# Patient Record
Sex: Male | Born: 1945 | Race: White | Hispanic: No | Marital: Married | Smoking: Never smoker
Health system: Southern US, Community
[De-identification: ages and names within clinical notes are randomized; demographics above are authoritative.]

## PROBLEM LIST (undated history)

## (undated) DIAGNOSIS — H269 Unspecified cataract: Secondary | ICD-10-CM

## (undated) DIAGNOSIS — I493 Ventricular premature depolarization: Secondary | ICD-10-CM

## (undated) DIAGNOSIS — R7302 Impaired glucose tolerance (oral): Secondary | ICD-10-CM

## (undated) DIAGNOSIS — R002 Palpitations: Secondary | ICD-10-CM

## (undated) DIAGNOSIS — I714 Abdominal aortic aneurysm, without rupture, unspecified: Secondary | ICD-10-CM

## (undated) DIAGNOSIS — I10 Essential (primary) hypertension: Secondary | ICD-10-CM

## (undated) DIAGNOSIS — E739 Lactose intolerance, unspecified: Secondary | ICD-10-CM

## (undated) DIAGNOSIS — N402 Nodular prostate without lower urinary tract symptoms: Secondary | ICD-10-CM

## (undated) DIAGNOSIS — M19071 Primary osteoarthritis, right ankle and foot: Secondary | ICD-10-CM

## (undated) DIAGNOSIS — K210 Gastro-esophageal reflux disease with esophagitis: Secondary | ICD-10-CM

## (undated) DIAGNOSIS — M19072 Primary osteoarthritis, left ankle and foot: Secondary | ICD-10-CM

## (undated) DIAGNOSIS — M25579 Pain in unspecified ankle and joints of unspecified foot: Secondary | ICD-10-CM

## (undated) DIAGNOSIS — G479 Sleep disorder, unspecified: Secondary | ICD-10-CM

## (undated) DIAGNOSIS — K219 Gastro-esophageal reflux disease without esophagitis: Secondary | ICD-10-CM

## (undated) DIAGNOSIS — H3320 Serous retinal detachment, unspecified eye: Secondary | ICD-10-CM

## (undated) DIAGNOSIS — E785 Hyperlipidemia, unspecified: Secondary | ICD-10-CM

## (undated) DIAGNOSIS — Z955 Presence of coronary angioplasty implant and graft: Secondary | ICD-10-CM

## (undated) DIAGNOSIS — IMO0002 Reserved for concepts with insufficient information to code with codable children: Secondary | ICD-10-CM

## (undated) DIAGNOSIS — E7800 Pure hypercholesterolemia, unspecified: Secondary | ICD-10-CM

## (undated) DIAGNOSIS — I8390 Asymptomatic varicose veins of unspecified lower extremity: Secondary | ICD-10-CM

## (undated) DIAGNOSIS — T7840XA Allergy, unspecified, initial encounter: Secondary | ICD-10-CM

## (undated) DIAGNOSIS — I2510 Atherosclerotic heart disease of native coronary artery without angina pectoris: Secondary | ICD-10-CM

## (undated) DIAGNOSIS — N400 Enlarged prostate without lower urinary tract symptoms: Secondary | ICD-10-CM

## (undated) HISTORY — DX: Serous retinal detachment, unspecified eye: H33.20

## (undated) HISTORY — DX: Pain in unspecified ankle and joints of unspecified foot: M25.579

## (undated) HISTORY — DX: Palpitations: R00.2

## (undated) HISTORY — DX: Pure hypercholesterolemia, unspecified: E78.00

## (undated) HISTORY — DX: Benign prostatic hyperplasia without lower urinary tract symptoms: N40.0

## (undated) HISTORY — DX: Allergy, unspecified, initial encounter: T78.40XA

## (undated) HISTORY — DX: Lactose intolerance, unspecified: E73.9

## (undated) HISTORY — DX: Primary osteoarthritis, left ankle and foot: M19.072

## (undated) HISTORY — DX: Unspecified cataract: H26.9

## (undated) HISTORY — DX: Essential (primary) hypertension: I10

## (undated) HISTORY — DX: Asymptomatic varicose veins of unspecified lower extremity: I83.90

## (undated) HISTORY — DX: Reserved for concepts with insufficient information to code with codable children: IMO0002

## (undated) HISTORY — DX: Primary osteoarthritis, right ankle and foot: M19.071

## (undated) HISTORY — DX: Nodular prostate without lower urinary tract symptoms: N40.2

## (undated) HISTORY — DX: Abdominal aortic aneurysm, without rupture, unspecified: I71.40

## (undated) HISTORY — DX: Ventricular premature depolarization: I49.3

## (undated) HISTORY — DX: Sleep disorder, unspecified: G47.9

## (undated) HISTORY — DX: Hyperlipidemia, unspecified: E78.5

## (undated) HISTORY — DX: Impaired glucose tolerance (oral): R73.02

## (undated) HISTORY — DX: Abdominal aortic aneurysm, without rupture: I71.4

## (undated) HISTORY — DX: Gastro-esophageal reflux disease without esophagitis: K21.9

## (undated) HISTORY — DX: Gastro-esophageal reflux disease with esophagitis: K21.0

---
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
## 1993-01-18 DIAGNOSIS — E785 Hyperlipidemia, unspecified: Secondary | ICD-10-CM

## 1993-01-18 HISTORY — DX: Hyperlipidemia, unspecified: E78.5

## 1997-01-18 DIAGNOSIS — N400 Enlarged prostate without lower urinary tract symptoms: Secondary | ICD-10-CM

## 1997-01-18 HISTORY — DX: Benign prostatic hyperplasia without lower urinary tract symptoms: N40.0

## 1999-01-19 HISTORY — PX: CATARACT EXTRACTION W/ INTRAOCULAR LENS IMPLANT: SHX1309

## 2000-01-19 DIAGNOSIS — G479 Sleep disorder, unspecified: Secondary | ICD-10-CM

## 2000-01-19 HISTORY — DX: Sleep disorder, unspecified: G47.9

## 2000-07-31 ENCOUNTER — Observation Stay (HOSPITAL_COMMUNITY): Admission: EM | Admit: 2000-07-31 | Discharge: 2000-07-31 | Payer: Self-pay | Admitting: Ophthalmology

## 2001-01-18 DIAGNOSIS — H3320 Serous retinal detachment, unspecified eye: Secondary | ICD-10-CM

## 2001-01-18 HISTORY — PX: RETINAL DETACHMENT SURGERY: SHX105

## 2001-01-18 HISTORY — DX: Serous retinal detachment, unspecified eye: H33.20

## 2002-01-18 DIAGNOSIS — K2100 Gastro-esophageal reflux disease with esophagitis, without bleeding: Secondary | ICD-10-CM

## 2002-01-18 HISTORY — DX: Gastro-esophageal reflux disease with esophagitis, without bleeding: K21.00

## 2007-01-19 DIAGNOSIS — N402 Nodular prostate without lower urinary tract symptoms: Secondary | ICD-10-CM

## 2007-01-19 HISTORY — DX: Nodular prostate without lower urinary tract symptoms: N40.2

## 2009-01-18 DIAGNOSIS — R7302 Impaired glucose tolerance (oral): Secondary | ICD-10-CM

## 2009-01-18 HISTORY — DX: Impaired glucose tolerance (oral): R73.02

## 2010-05-27 ENCOUNTER — Other Ambulatory Visit: Payer: Self-pay | Admitting: Family Medicine

## 2010-05-27 DIAGNOSIS — I714 Abdominal aortic aneurysm, without rupture: Secondary | ICD-10-CM

## 2010-05-29 ENCOUNTER — Ambulatory Visit
Admission: RE | Admit: 2010-05-29 | Discharge: 2010-05-29 | Disposition: A | Discharge status: Home / Self Care | Payer: Medicare Other | Source: Ambulatory Visit | Attending: Family Medicine | Admitting: Family Medicine

## 2010-05-29 DIAGNOSIS — I714 Abdominal aortic aneurysm, without rupture: Secondary | ICD-10-CM

## 2011-11-19 ENCOUNTER — Other Ambulatory Visit: Payer: Self-pay | Admitting: Orthopedic Surgery

## 2011-11-23 NOTE — Progress Notes (Signed)
Pt instructions given

## 2011-11-24 NOTE — H&P (Signed)
    Recently while working in his yard he impaled his right index MP joint on a rose thorn. He was able to remove the thorn, but was immediately aware of an impaled foreign body deep to the skin.   He cleaned the wound well, but was unable to extrude his thorn foreign body.   He has developed swelling and tenderness.  The date of injury was 10.10.13.   He now presents for evaluation of his index MP joint.    His past history is reviewed in detail. He is 6'3" tall and weighs 185 pounds. He is not taking any prescription pain medicine.  He has not taken any antibiotics to date. He has no drug allergies.  Current medications are simvastatin.  Prior surgery, tonsillectomy 1950, cataract extraction and lens implant in 2000, detached retina in 2003.    Social history reveals that he is married, he is a nonsmoker, he enjoys a rare alcoholic beverage.    Family history is detailed and positive for father having coronary artery disease.  14-point review of systems reveals corrective lenses, history of cataracts.    Physical examination reveals a thin, fit, well appearing 66 year-old gentleman.  He was a Veterinary surgeon at [employer]. His hand examination reveals an obvious foreign body on the radial aspect of his right index finger MP joint. This is mildly tender to touch. There is an encapsulated area of granulation tissue.  He does not show generalized swelling or signs of a fungal/ sporotrichoses type response. He has no sign of lymphangitis or lymphadenopathy.  Plain films of his hand are nondiagnostic.  ASSESSMENT:   Buried thorn foreign body.   PLAN:  We will schedule him for removal under straight local anesthesia.  While the risk is small, rose thorns can lead to a chronic infection with sporotrichosis or other fungal elements.  We will removal his foreign body and cover him with doxycycline and/or Biaxin for several weeks post-op to be absolutely certain this is not a problem. H&P documentation:  11/25/2011  -History and Physical Reviewed  -Patient has been re-examined  -No change in the plan of care  Wyn Forster, MD

## 2011-11-25 ENCOUNTER — Encounter (HOSPITAL_BASED_OUTPATIENT_CLINIC_OR_DEPARTMENT_OTHER)
Admission: RE | Disposition: A | Discharge status: Home / Self Care | Payer: Self-pay | Source: Ambulatory Visit | Attending: Orthopedic Surgery

## 2011-11-25 ENCOUNTER — Ambulatory Visit (HOSPITAL_BASED_OUTPATIENT_CLINIC_OR_DEPARTMENT_OTHER)
Admission: RE | Admit: 2011-11-25 | Discharge: 2011-11-25 | Disposition: A | Discharge status: Home / Self Care | Payer: Medicare Other | Source: Ambulatory Visit | Attending: Orthopedic Surgery | Admitting: Orthopedic Surgery

## 2011-11-25 DIAGNOSIS — Y998 Other external cause status: Secondary | ICD-10-CM | POA: Insufficient documentation

## 2011-11-25 DIAGNOSIS — Z1839 Other retained organic fragments: Secondary | ICD-10-CM | POA: Insufficient documentation

## 2011-11-25 DIAGNOSIS — W268XXA Contact with other sharp object(s), not elsewhere classified, initial encounter: Secondary | ICD-10-CM | POA: Insufficient documentation

## 2011-11-25 DIAGNOSIS — S61409A Unspecified open wound of unspecified hand, initial encounter: Secondary | ICD-10-CM | POA: Insufficient documentation

## 2011-11-25 HISTORY — PX: LESION REMOVAL: SHX5196

## 2011-11-25 SURGERY — MINOR EXCISION OF LESION
Anesthesia: LOCAL | Site: Finger | Laterality: Right | Wound class: Clean

## 2011-11-25 MED ORDER — DOXYCYCLINE HYCLATE 100 MG PO TABS: 100.0000 mg | ORAL_TABLET | Freq: Two times a day (BID) | ORAL | Status: DC

## 2011-11-25 MED ORDER — TRAMADOL HCL 50 MG PO TABS: ORAL_TABLET | ORAL | Status: DC

## 2011-11-25 MED ORDER — LIDOCAINE HCL 2 % IJ SOLN
INTRAMUSCULAR | Status: DC | PRN
  Administered 2011-11-25: 4 mL

## 2011-11-25 SURGICAL SUPPLY — 40 items
BANDAGE ADHESIVE 1X3 (GAUZE/BANDAGES/DRESSINGS) IMPLANT
BLADE SURG 15 STRL LF DISP TIS (BLADE) ×1 IMPLANT
BLADE SURG 15 STRL SS (BLADE) ×2
BNDG CMPR 9X4 STRL LF SNTH (GAUZE/BANDAGES/DRESSINGS)
BNDG CMPR MD 5X2 ELC HKLP STRL (GAUZE/BANDAGES/DRESSINGS) ×1
BNDG COHESIVE 1X5 TAN STRL LF (GAUZE/BANDAGES/DRESSINGS) IMPLANT
BNDG ELASTIC 2 VLCR STRL LF (GAUZE/BANDAGES/DRESSINGS) ×1 IMPLANT
BNDG ESMARK 4X9 LF (GAUZE/BANDAGES/DRESSINGS) IMPLANT
BRUSH SCRUB EZ PLAIN DRY (MISCELLANEOUS) ×2 IMPLANT
CLOTH BEACON ORANGE TIMEOUT ST (SAFETY) ×2 IMPLANT
CORDS BIPOLAR (ELECTRODE) IMPLANT
COVER MAYO STAND STRL (DRAPES) ×2 IMPLANT
CUFF TOURNIQUET SINGLE 18IN (TOURNIQUET CUFF) ×1 IMPLANT
DECANTER SPIKE VIAL GLASS SM (MISCELLANEOUS) IMPLANT
DRAIN PENROSE 1/2X12 LTX STRL (WOUND CARE) IMPLANT
DRAPE SURG 17X23 STRL (DRAPES) ×2 IMPLANT
GAUZE SPONGE 4X4 12PLY STRL LF (GAUZE/BANDAGES/DRESSINGS) ×2 IMPLANT
GAUZE XEROFORM 1X8 LF (GAUZE/BANDAGES/DRESSINGS) ×1 IMPLANT
GLOVE BIO SURGEON STRL SZ 6.5 (GLOVE) ×2 IMPLANT
GLOVE BIOGEL M STRL SZ7.5 (GLOVE) ×2 IMPLANT
GLOVE ORTHO TXT STRL SZ7.5 (GLOVE) ×2 IMPLANT
GOWN BRE IMP PREV XXLGXLNG (GOWN DISPOSABLE) ×2 IMPLANT
GOWN PREVENTION PLUS XLARGE (GOWN DISPOSABLE) ×1 IMPLANT
NDL SAFETY ECLIPSE 18X1.5 (NEEDLE) IMPLANT
NEEDLE 27GAX1X1/2 (NEEDLE) ×1 IMPLANT
NEEDLE HYPO 18GX1.5 SHARP (NEEDLE) ×2
PACK BASIN DAY SURGERY FS (CUSTOM PROCEDURE TRAY) ×2 IMPLANT
PADDING CAST ABS 4INX4YD NS (CAST SUPPLIES)
PADDING CAST ABS COTTON 4X4 ST (CAST SUPPLIES) ×1 IMPLANT
SPONGE GAUZE 4X4 12PLY (GAUZE/BANDAGES/DRESSINGS) ×2 IMPLANT
STOCKINETTE 4X48 STRL (DRAPES) ×2 IMPLANT
SUT CHROMIC 6 0 PS 4 (SUTURE) ×1 IMPLANT
SUT ETHILON 5 0 P 3 18 (SUTURE)
SUT NYLON ETHILON 5-0 P-3 1X18 (SUTURE) ×1 IMPLANT
SYR 3ML 23GX1 SAFETY (SYRINGE) IMPLANT
SYR CONTROL 10ML LL (SYRINGE) ×1 IMPLANT
TOWEL OR 17X24 6PK STRL BLUE (TOWEL DISPOSABLE) ×4 IMPLANT
TRAY DSU PREP LF (CUSTOM PROCEDURE TRAY) ×2 IMPLANT
UNDERPAD 30X30 INCONTINENT (UNDERPADS AND DIAPERS) ×2 IMPLANT
WATER STERILE IRR 1000ML POUR (IV SOLUTION) ×1 IMPLANT

## 2011-11-25 NOTE — Brief Op Note (Signed)
11/25/2011  12:41 PM  PATIENT:  Rod Scheibe  66 y.o. male  PRE-OPERATIVE DIAGNOSIS:  ROSE THORN FOREIGN BODY RIGHT INDEX FINGER  POST-OPERATIVE DIAGNOSIS: encapsulated rose thorn right index MP joint region  PROCEDURE:  Procedure(s) (LRB) with comments: MINOR EXICISION OF LESION (Right) - REMOVE THORN RIGHT INDEX FINGER  SURGEON:  Surgeon(s) and Role:    * Wyn Forster., MD - Primary  PHYSICIAN ASSISTANT:   ASSISTANTS:Brie Eppard Dasnoit,P.A-C   ANESTHESIA:   local  EBL:     BLOOD ADMINISTERED:none  DRAINS: none   LOCAL MEDICATIONS USED:  XYLOCAINE   SPECIMEN:  No Specimen  DISPOSITION OF SPECIMEN:  N/A  COUNTS:  YES  TOURNIQUET:   Total Tourniquet Time Documented: Forearm (Right) - 6 minutes  DICTATION: .Other Dictation: Dictation Number [id number]  PLAN OF CARE: Discharge to home after PACU  PATIENT DISPOSITION:  PACU - hemodynamically stable.

## 2011-11-25 NOTE — Op Note (Signed)
[id number] 

## 2011-11-26 ENCOUNTER — Encounter (HOSPITAL_BASED_OUTPATIENT_CLINIC_OR_DEPARTMENT_OTHER): Payer: Self-pay | Admitting: Orthopedic Surgery

## 2011-11-29 NOTE — Op Note (Signed)
NAMEKADIN, CAISSE NO.:  [id number]  MEDICAL RECORD NO.:  [id number]  LOCATION:                                 FACILITY:  PHYSICIAN:  Katy Fitch. Farron Watrous, M.D. DATE OF BIRTH:  [birth year]  DATE OF PROCEDURE:  11/25/2011 DATE OF DISCHARGE:                              OPERATIVE REPORT   PREOPERATIVE DIAGNOSIS:  Retained rose thorn foreign body, right index finger metacarpophalangeal joint, radial aspect.  POSTOPERATIVE DIAGNOSIS:  Encapsulated rose thorn foreign body.  OPERATION:  Removal of encapsulated rose thorn foreign body.  OPERATING SURGEON:  Katy Fitch. Shakala Marlatt, M.D.  ASSISTANT:  Jonni Sanger, P.A.  ANESTHESIA:  2% lidocaine field block of right index metacarpophalangeal joint region, 5 mL of 2% plain lidocaine.  ANESTHETIST:  Katy Fitch. Sherrian Nunnelley, M.D.  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 Mosetta Putt, for evaluation and management of a retained rose thorn granuloma.  Mr. Delaney enjoys raising roses.  He impaled his hand on the thorn more than a month ago.  He developed an encapsulated foreign body response.  Due to the proclivity of rose thorn injuries to develop late atypical infection, we recommended excision of the rose thorn and careful exploration of the wound.  After detailed informed consent in the office, he is brought to the operating room at this time.  DESCRIPTION OF PROCEDURE:  Rod Proud was brought to room 4 at the [facility], placed in supine position on the operating table.  Following informed consent and Betadine prep, 5 mL of 2% lidocaine was infiltrated into the region of the radial aspect of the index MP joint to obtain a field block.  The right hand and arm were then prepped with Betadine soap and solution, sterilely draped.  A pneumatic tourniquet was applied to the proximal  right forearm.  After testing for complete anesthesia, we exsanguinated the hand and forearm by direct compression and inflated the arterial tourniquet to 220 mmHg.  Following routine surgical time-out, we performed a short longitudinal incision directly over the mass.  Subcutaneous tissues were carefully divided, taking care to identify a radial superficial sensory nerve branch and the vein.  These were retracted.  An encapsulated mass was identified.  This was isolated, split, and a rose thorn removed.  We showed this to Mr. Rohrig.  The wound was then carefully inspected for secondary fragments and none were identified.  The wound was then repaired with intradermal #6-0 chromic suture.  The wound was dressed with Xeroflo sterile gauze and Coban.  For aftercare, he is provided a prescription for tramadol 50 mg 1 or 2 tablets p.o. q.4-6 hours p.r.n. pain, 20 tablets without refill.  We will see him back for followup in our office in approximately 1 week to check on wound healing.  He has been advised about the potential for fungal or other atypical infection with this foreign body.  He will be vigilant, looking for signs of complications of the foreign body.     Katy Fitch [place], M.D.     RVS/MEDQ  D:  11/25/2011  T:  11/26/2011  Job:  [id number]  cc:   Mosetta Putt, M.D.

## 2012-07-24 ENCOUNTER — Telehealth: Payer: Self-pay | Admitting: *Deleted

## 2012-07-24 DIAGNOSIS — I493 Ventricular premature depolarization: Secondary | ICD-10-CM

## 2012-07-24 NOTE — Telephone Encounter (Signed)
Order for 24 hour holter monitor completed.

## 2012-07-27 ENCOUNTER — Encounter (INDEPENDENT_AMBULATORY_CARE_PROVIDER_SITE_OTHER): Payer: Medicare PPO

## 2012-07-27 ENCOUNTER — Encounter: Payer: Self-pay | Admitting: *Deleted

## 2012-07-27 DIAGNOSIS — I493 Ventricular premature depolarization: Secondary | ICD-10-CM

## 2012-07-27 DIAGNOSIS — I4949 Other premature depolarization: Secondary | ICD-10-CM

## 2012-07-27 NOTE — Progress Notes (Signed)
Patient ID: Billy Adams, male   DOB: May 11, 1945, 67 y.o.   MRN: [id number] E-Cardio 24 Hour Holter monitor applied to patient.

## 2012-10-18 ENCOUNTER — Ambulatory Visit: Payer: Medicare PPO | Admitting: Cardiology

## 2012-11-16 ENCOUNTER — Encounter: Payer: Self-pay | Admitting: *Deleted

## 2012-11-20 ENCOUNTER — Encounter: Payer: Self-pay | Admitting: Cardiology

## 2012-11-20 ENCOUNTER — Ambulatory Visit (INDEPENDENT_AMBULATORY_CARE_PROVIDER_SITE_OTHER): Payer: Medicare PPO | Admitting: Cardiology

## 2012-11-20 VITALS — BP 136/80 | HR 66 | Ht 75.0 in | Wt 186.8 lb

## 2012-11-20 DIAGNOSIS — Z789 Other specified health status: Secondary | ICD-10-CM

## 2012-11-20 DIAGNOSIS — I493 Ventricular premature depolarization: Secondary | ICD-10-CM | POA: Insufficient documentation

## 2012-11-20 DIAGNOSIS — R7302 Impaired glucose tolerance (oral): Secondary | ICD-10-CM | POA: Insufficient documentation

## 2012-11-20 DIAGNOSIS — I714 Abdominal aortic aneurysm, without rupture: Secondary | ICD-10-CM | POA: Insufficient documentation

## 2012-11-20 DIAGNOSIS — I4949 Other premature depolarization: Secondary | ICD-10-CM

## 2012-11-20 DIAGNOSIS — G479 Sleep disorder, unspecified: Secondary | ICD-10-CM

## 2012-11-20 DIAGNOSIS — E785 Hyperlipidemia, unspecified: Secondary | ICD-10-CM

## 2012-11-20 DIAGNOSIS — I10 Essential (primary) hypertension: Secondary | ICD-10-CM | POA: Insufficient documentation

## 2012-11-20 DIAGNOSIS — Z9189 Other specified personal risk factors, not elsewhere classified: Secondary | ICD-10-CM

## 2012-11-20 DIAGNOSIS — M19071 Primary osteoarthritis, right ankle and foot: Secondary | ICD-10-CM | POA: Insufficient documentation

## 2012-11-20 DIAGNOSIS — N400 Enlarged prostate without lower urinary tract symptoms: Secondary | ICD-10-CM | POA: Insufficient documentation

## 2012-11-20 DIAGNOSIS — N402 Nodular prostate without lower urinary tract symptoms: Secondary | ICD-10-CM | POA: Insufficient documentation

## 2012-11-20 DIAGNOSIS — E739 Lactose intolerance, unspecified: Secondary | ICD-10-CM

## 2012-11-20 DIAGNOSIS — I8390 Asymptomatic varicose veins of unspecified lower extremity: Secondary | ICD-10-CM

## 2012-11-20 DIAGNOSIS — R002 Palpitations: Secondary | ICD-10-CM

## 2012-11-20 DIAGNOSIS — IMO0002 Reserved for concepts with insufficient information to code with codable children: Secondary | ICD-10-CM | POA: Insufficient documentation

## 2012-11-20 NOTE — Patient Instructions (Signed)
Your physician has requested that you have an exercise tolerance test. For further information please visit [email or web address] Please also follow instruction sheet, as given.  Your physician has requested that you have an echocardiogram. Echocardiography is a painless test that uses sound waves to create images of your heart. It provides your doctor with information about the size and shape of your heart and how well your heart's chambers and valves are working. This procedure takes approximately one hour. There are no restrictions for this procedure.  Your physician recommends that you schedule a follow-up appointment in: 2 weeks with Dr Shirlee Latch.

## 2012-11-21 DIAGNOSIS — Z9189 Other specified personal risk factors, not elsewhere classified: Secondary | ICD-10-CM | POA: Insufficient documentation

## 2012-11-21 NOTE — Progress Notes (Signed)
Patient ID: Billy Adams, male   DOB: Jun 08, 1945, 67 y.o.   MRN: [id number] PCP: Dr. Duaine Dredge  67 yo with rather minimal past history presents for evaluation of PVCs.  Since high school, he can remember feeling palpitations with stressful events (public speaking, band solo, etc).  However, this summer, the PVCs did not come to light necessarily through symptomatic palpitations.  He actually would take his pulse after exercise and note irregularity.  He started to check his pulse at other times and noted irregularity.  He was seen by Dr. Duaine Dredge and had a holter monitor in 7/14 that showed frequent PVCs (7% of total complexes).  He cut caffeine out almost entirely, and now rarely notes irregularity in his pulse after exercise or at other times.  Listening at rest today, I did not hear any PVCs.  He did 20 squats in the room and I heard 2 PVCs out of about 100 beats.  ECG was unremarkable.    Patient has good exercise tolerance.  He walks and works out at [employer] though not regularly.  No exertional dyspnea or chest pain.  No lightheadedness or syncope.  He would like to start exercising more but is concerned given the PVCs he has noted post-exercise in the past.    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  2. GERD 3. Hyperlipidemia 4. BPH 5. History of prostate nodule 6. Venous varicosities 7. Degenerative disc disease  8. Impaired fasting glucose  SH: Married, retired Hospital doctor, nonsmoker, rare ETOH.   FH: Father with first MI at 73, also with rheumatic heart disease.  Grandmother with CVA, mother with CVA at age 50.   ROS: All systems reviewed and negative except as per HPI.   Current Outpatient Prescriptions  Medication Sig Dispense Refill  . ALPRAZolam (XANAX) 0.25 MG tablet Take 1/2 tablet at night as needed for sleep a few times a month      . GRAPE SEED CR PO Take 2 tablets by mouth  daily. Muscadine grape seed (resveratol)      . loratadine (CLARITIN) 10 MG tablet Take 10 mg by mouth as needed for allergies.      . Melatonin 3 MG TABS Take 1 tablet by mouth as needed.      . Multiple Vitamins-Minerals (MULTIVITAMIN WITH MINERALS) tablet Take 1 tablet by mouth daily.      . Omega-3 Fatty Acids (FISH OIL) 600 MG CAPS Take 2 tablets by mouth daily.      . simvastatin (ZOCOR) 10 MG tablet Take 10 mg by mouth at bedtime.       No current facility-administered medications for this visit.    BP 136/80  Pulse 66  Ht 6\' 3"  (1.905 m)  Wt 84.732 kg (186 lb 12.8 oz)  BMI 23.35 kg/m2  SpO2 97% General: NAD Neck: No JVD, no thyromegaly or thyroid nodule.  Lungs: Clear to auscultation bilaterally with normal respiratory effort. CV: Nondisplaced PMI.  Heart regular S1/S2, soft S4, no murmur.  No peripheral edema.  No carotid bruit.  Normal pedal pulses.  Abdomen: Soft, nontender, no hepatosplenomegaly, no distention.  Skin: Intact without lesions or rashes.  Neurologic: Alert and oriented x 3.  Psych: Normal affect. Extremities: No clubbing or cyanosis.  HEENT: Normal.   Assessment/Plan: 1. PVCs: These seem to have subsided since he cut back on caffeine.  I suspect that his PVCs are benign  based on this.  He did have a fair amount initially, 7% of total complexes.  Usually, would expect > 15% total PVCs before there is risk for a cardiomyopathy.  I do think that we need to do an echocardiogram to make sure that his heart is structurally normal.  As he is interested in starting an aerobic exercise regimen, I will also have him do an ETT (this will additionally allow me to see the PVC pattern with and following exercise).  I am not going to repeat the holter at this time as I think that the PVC total is almost certainly lower than it was this summer.  I would agree with staying off caffeine. No need for beta blocker at this point.  2. CAD risk: As above, will get ETT prior to  advancing exercise regimen.  I would also like him to take ASA 81 mg daily.  Given family history of CAD, would ensure lipids are controlled.  Dr. Duaine Dredge has him on simvastatin.   Marca Ancona 11/21/2012 9:13 AM

## 2012-12-01 ENCOUNTER — Other Ambulatory Visit: Payer: Self-pay

## 2012-12-04 ENCOUNTER — Ambulatory Visit (HOSPITAL_BASED_OUTPATIENT_CLINIC_OR_DEPARTMENT_OTHER): Payer: Medicare PPO | Admitting: Radiology

## 2012-12-04 ENCOUNTER — Ambulatory Visit (INDEPENDENT_AMBULATORY_CARE_PROVIDER_SITE_OTHER): Payer: Medicare PPO | Admitting: Cardiology

## 2012-12-04 ENCOUNTER — Ambulatory Visit: Payer: Medicare PPO | Admitting: Cardiology

## 2012-12-04 ENCOUNTER — Ambulatory Visit (HOSPITAL_COMMUNITY): Payer: Medicare PPO | Attending: Cardiology

## 2012-12-04 DIAGNOSIS — I10 Essential (primary) hypertension: Secondary | ICD-10-CM

## 2012-12-04 DIAGNOSIS — I4949 Other premature depolarization: Secondary | ICD-10-CM | POA: Insufficient documentation

## 2012-12-04 DIAGNOSIS — R9439 Abnormal result of other cardiovascular function study: Secondary | ICD-10-CM

## 2012-12-04 DIAGNOSIS — I079 Rheumatic tricuspid valve disease, unspecified: Secondary | ICD-10-CM | POA: Insufficient documentation

## 2012-12-04 DIAGNOSIS — R002 Palpitations: Secondary | ICD-10-CM

## 2012-12-04 DIAGNOSIS — E785 Hyperlipidemia, unspecified: Secondary | ICD-10-CM

## 2012-12-04 DIAGNOSIS — R0989 Other specified symptoms and signs involving the circulatory and respiratory systems: Secondary | ICD-10-CM

## 2012-12-04 DIAGNOSIS — I493 Ventricular premature depolarization: Secondary | ICD-10-CM

## 2012-12-04 DIAGNOSIS — R079 Chest pain, unspecified: Secondary | ICD-10-CM

## 2012-12-04 MED ORDER — METOPROLOL TARTRATE 25 MG PO TABS: ORAL_TABLET | ORAL | Status: DC

## 2012-12-04 NOTE — Progress Notes (Signed)
Echocardiogram performed.  

## 2012-12-04 NOTE — Progress Notes (Signed)
Patient ID: Billy Adams, male   DOB: 07/23/1945, 67 y.o.   MRN: [id number] Exercise Treadmill Test  Pre-Exercise Testing Evaluation Rhythm: sinus bradycardia  Rate: 58   PR:  .16 QRS:  .11  QT:  .41 QTc: .40           Test  Exercise Tolerance Test Ordering MD: Marca Ancona, MD  Interpreting MD: Marca Ancona, MD  Unique Test No: 1 Treadmill:  2  Indication for ETT: palpitations  Contraindication to ETT: No   Stress Modality: exercise - treadmill  Cardiac Imaging Performed: non   Protocol: standard Bruce - maximal  Max BP:  184/86  Max MPHR (bpm):  151 85%MPHR (bpm):  128  MPHR obtained (bpm):  151 % MPHR obtained:  100  Reached 85% MPHR (min:sec):  8:00 Total Exercise Time (min-sec):  10:05  Workload in METS:  11.8 Borg Scale: 17  Reason ETT Terminated:  dizziness and fatigue    ST Segment Analysis At Rest: normal ST segments - no evidence of significant ST depression With Exercise: At peak stress, there was 1.5 mm horizontal ST depression in the inferior leads and V4-V6.   Other Information Arrhythmia:  Yes.  There were PVCs and bigeminy during exercise.  No PVCs prior to exercise, rare PVCs post-exercise.  Angina during ETT:  absent (0) Quality of ETT:  diagnostic  ETT Interpretation:  abnormal - evidence of ST depression consistent with ischemia.  Significant ST depression but it resolved quickly in recovery (by 1 minute).  There were frequent PVCs and bigeminy in exercise but minimal PVCs before or after.  No chest pain.   Comments: Will arrange coronary CT angiogram give abnormal ETT.

## 2012-12-04 NOTE — Addendum Note (Signed)
Addended by: Jacqlyn Krauss on: 12/04/2012 03:57 PM   Modules accepted: Orders

## 2012-12-04 NOTE — Patient Instructions (Signed)
Your physician recommends that you have  lab work today--BMET.  Your physician has requested that you have cardiac CT. Cardiac computed tomography (CT) is a painless test that uses an x-ray machine to take clear, detailed pictures of your heart. For further information please visit [email or web address] Please follow instruction sheet as given.   Take metoprolol tartrate (lopressor) 25mg  about 2 hours before you Cardiac CT is done.

## 2012-12-05 LAB — BASIC METABOLIC PANEL
GFR: 87.59 mL/min (ref >60.00)
Potassium: 4.7 mEq/L (ref 3.5–5.1)
Sodium: 136 mEq/L (ref 135–145)

## 2012-12-11 ENCOUNTER — Telehealth: Payer: Self-pay | Admitting: *Deleted

## 2012-12-11 NOTE — Telephone Encounter (Signed)
Left message for Billy Adams to call and schedule appointment per [place].

## 2012-12-11 NOTE — Telephone Encounter (Signed)
Patient is aware of appointment °

## 2012-12-20 ENCOUNTER — Ambulatory Visit (HOSPITAL_COMMUNITY)
Admission: RE | Admit: 2012-12-20 | Discharge: 2012-12-20 | Disposition: A | Discharge status: Home / Self Care | Payer: Medicare PPO | Source: Ambulatory Visit | Attending: Cardiology | Admitting: Cardiology

## 2012-12-20 DIAGNOSIS — R079 Chest pain, unspecified: Secondary | ICD-10-CM

## 2012-12-20 DIAGNOSIS — R002 Palpitations: Secondary | ICD-10-CM

## 2012-12-20 DIAGNOSIS — R9439 Abnormal result of other cardiovascular function study: Secondary | ICD-10-CM

## 2012-12-20 DIAGNOSIS — R0789 Other chest pain: Secondary | ICD-10-CM | POA: Insufficient documentation

## 2012-12-20 DIAGNOSIS — I2510 Atherosclerotic heart disease of native coronary artery without angina pectoris: Secondary | ICD-10-CM | POA: Insufficient documentation

## 2012-12-20 DIAGNOSIS — R911 Solitary pulmonary nodule: Secondary | ICD-10-CM | POA: Insufficient documentation

## 2012-12-20 MED ORDER — NITROGLYCERIN 0.4 MG SL SUBL
SUBLINGUAL_TABLET | SUBLINGUAL | Status: AC
  Filled 2012-12-20: qty 25

## 2012-12-20 MED ORDER — NITROGLYCERIN 0.4 MG SL SUBL
0.4000 mg | SUBLINGUAL_TABLET | SUBLINGUAL | Status: DC | PRN
  Administered 2012-12-20: 0.4 mg via SUBLINGUAL
  Filled 2012-12-20: qty 25

## 2012-12-20 MED ORDER — IOHEXOL 350 MG/ML SOLN
80.0000 mL | Freq: Once | INTRAVENOUS | Status: AC | PRN
  Administered 2012-12-20: 80 mL via INTRAVENOUS

## 2012-12-20 MED ORDER — IOHEXOL 350 MG/ML SOLN
100.0000 mL | Freq: Once | INTRAVENOUS | Status: AC | PRN
  Administered 2012-12-20: 80 mL via INTRAVENOUS

## 2012-12-20 MED ORDER — METOPROLOL TARTRATE 1 MG/ML IV SOLN
INTRAVENOUS | Status: AC
  Filled 2012-12-20: qty 5

## 2012-12-20 MED ORDER — METOPROLOL TARTRATE 1 MG/ML IV SOLN
5.0000 mg | Freq: Once | INTRAVENOUS | Status: DC
  Filled 2012-12-20: qty 5

## 2012-12-21 ENCOUNTER — Telehealth: Payer: Self-pay | Admitting: *Deleted

## 2012-12-21 MED ORDER — ATORVASTATIN CALCIUM 40 MG PO TABS: 40.0000 mg | ORAL_TABLET | Freq: Every day | ORAL | Status: DC

## 2012-12-21 NOTE — Telephone Encounter (Signed)
Message copied by Barrie Folk on Thu Dec 21, 2012  3:03 PM ------      Message from: Laurey Morale      Created: Thu Dec 21, 2012  1:43 PM       This patient needs an appointment with me next week.  Please arrange today.  Please stop simvastatin and start atorvastatin 40 mg daily.  Needs lipids/LFTs in 2 months.  ------

## 2012-12-21 NOTE — Telephone Encounter (Signed)
I spoke with pt & have scheduled him as requested by Dr. Shirlee Latch an appointment next week 12/28/12 at 3pm. He will stop simvastatin & start atorvastatin 40mg  as directed.  States his daughter is a doctor and she told him "not to take a certain statin... But I can't remember what it is"    He will call back if it is the Atorvastatin Mylo Red RN

## 2012-12-22 ENCOUNTER — Telehealth: Payer: Self-pay | Admitting: Cardiology

## 2012-12-22 NOTE — Telephone Encounter (Signed)
New problem   Pt stated someone called him and didn't leave a message. Please call pt

## 2012-12-22 NOTE — Telephone Encounter (Signed)
Spoke with patient.

## 2012-12-25 ENCOUNTER — Encounter: Payer: Self-pay | Admitting: Cardiology

## 2012-12-25 ENCOUNTER — Ambulatory Visit (INDEPENDENT_AMBULATORY_CARE_PROVIDER_SITE_OTHER): Payer: Medicare PPO | Admitting: Cardiology

## 2012-12-25 VITALS — BP 126/80 | HR 69 | Ht 75.5 in | Wt 185.0 lb

## 2012-12-25 DIAGNOSIS — I493 Ventricular premature depolarization: Secondary | ICD-10-CM

## 2012-12-25 DIAGNOSIS — I4949 Other premature depolarization: Secondary | ICD-10-CM

## 2012-12-25 DIAGNOSIS — I2510 Atherosclerotic heart disease of native coronary artery without angina pectoris: Secondary | ICD-10-CM | POA: Insufficient documentation

## 2012-12-25 HISTORY — DX: Atherosclerotic heart disease of native coronary artery without angina pectoris: I25.10

## 2012-12-25 NOTE — Patient Instructions (Signed)
Call and let Dr Shirlee Latch know when you are ready to schedule your cardiac catheterization.

## 2012-12-25 NOTE — Progress Notes (Signed)
Patient ID: Billy Adams, male   DOB: 1945-11-26, 67 y.o.   MRN: [id number] PCP: Dr. Duaine Dredge  67 yo with presents for followup of PVCs and CAD.  Since high school, he can remember feeling palpitations with stressful events (public speaking, band solo, etc).  This summer, patient began to notice irregularity in his pulse after exercise.  He was seen by Dr. Duaine Dredge and had a holter monitor in 7/14 that showed frequent PVCs (7% of total complexes).  He cut caffeine out almost entirely but continued to have PVCs with and after exercise.  Patient has good exercise tolerance.  He walks and works out at [employer] though not regularly.  No exertional dyspnea or chest pain.  No lightheadedness or syncope.  He would like to start exercising more but but has been concerned about the frequent PVCs.  I had him get an echocardiogram and an ETT.  The echo showed normal LV and RV size and function. The ETT showed good exercise tolerance, but he had frequent PVCs including bigeminy with exercise and in recovery.  He also was noted to have significant ST depression with exercise.  Therefore, I had him do a coronary CT angiogram.  This showed quite extensive plaque in his coronaries.  The proximal LAD was especially involved, and there was concern for moderate to severe proximal LAD stenosis.  He returns today to discuss future treatment.    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  Echo (11/14) with EF 55-60%, no significant valvular abnormalities.  2. GERD 3. Hyperlipidemia 4. BPH 5. History of prostate nodule 6. Venous varicosities 7. Degenerative disc disease  8. Impaired fasting glucose 9. CAD:  ETT (11/14) with 10:05 exercise, 1.5 mm ST depression V4-V6 and in the inferior leads, frequent PVCs including bigeminy with exercise, PVCs in recovery.  Coronary CT angiography (11/14) with calcium score 1659 Agatston units (95th percentile for  age/gender), moderate to severe proximal LAD stenosis.   SH: Married, retired Hospital doctor, nonsmoker, rare ETOH.   FH: Father with first MI at 49, also with rheumatic heart disease.  Grandmother with CVA, mother with CVA at age 100.   ROS: All systems reviewed and negative except as per HPI.   Current Outpatient Prescriptions  Medication Sig Dispense Refill  . aspirin 81 MG tablet Take 81 mg by mouth daily.      Marland Kitchen atorvastatin (LIPITOR) 40 MG tablet Take 1 tablet (40 mg total) by mouth daily.  90 tablet  3  . GRAPE SEED CR PO Take 2 tablets by mouth daily. Muscadine grape seed (resveratol)      . loratadine (CLARITIN) 10 MG tablet Take 10 mg by mouth as needed for allergies.      . Melatonin 3 MG TABS Take 1 tablet by mouth as needed.      . metoprolol tartrate (LOPRESSOR) 25 MG tablet Take 1 tablet two hours before CT scan is done  1 tablet  0  . Multiple Vitamins-Minerals (MULTIVITAMIN WITH MINERALS) tablet Take 1 tablet by mouth daily.      . Omega-3 Fatty Acids (FISH OIL) 600 MG CAPS Take 2 tablets by mouth daily.      Marland Kitchen ALPRAZolam (XANAX) 0.25 MG tablet Take 1/2 tablet at night as needed for sleep a few times a month       No current facility-administered medications for this visit.    BP 126/80  Pulse 69  Ht 6' 3.5" (1.918 m)  Wt 83.915 kg (185 lb)  BMI 22.81 kg/m2  SpO2 99% General: NAD Neck: No JVD, no thyromegaly or thyroid nodule.  Lungs: Clear to auscultation bilaterally with normal respiratory effort. CV: Nondisplaced PMI.  Heart regular S1/S2, soft S4, no murmur.  No peripheral edema.  No carotid bruit.  Normal pedal pulses.  Abdomen: Soft, nontender, no hepatosplenomegaly, no distention.  Skin: Intact without lesions or rashes.  Neurologic: Alert and oriented x 3.  Psych: Normal affect. Extremities: No clubbing or cyanosis.  HEENT: Normal.   Assessment/Plan: 1. PVCs: Patient had a significant increase in PVCs including bigeminy during exercise  and in recovery with ETT.  This pattern does suggest a higher risk for cardiovascular mortality and concerns me that he has underlying obstructive coronary disease.  2. CAD: There was extensive coronary plaque on coronary CTA, with a calcium score placing the patient in the 95th percentile for age and gender (suggesting high risk of future cardiac events).  I am concerned from the images that he has a moderate to severe proximal LAD stenosis.  I suspect that the disease in his other vessels is nonobstructive.  Given the findings from ETT and the coronary CTA, I think that the best course would be coronary angiography.  He does not have classic symptoms of ischemia (chest pain, dyspnea), but the frequent PVCs and bigeminy with exercise are concerning for the presence of ischemia. Additionally, he had ST depression on the treadmill ECG.  We discussed all the risks/benefits of coronary angiography today and decided to go forward with it.  If he has a tight proximal LAD stenosis as suggested by the CTA, would plan PCI. He will continue ASA 81 mg daily.  I did change his statin to atorvastatin 40 mg daily (more potent) and will plan to check lipids/LFTs in 2 months. He is going to talk with his family and decide when he would like to schedule the catheterization (needs one of his daughters to be available).  I asked him not to undertake strenuous exercise until we do the cardiac cath, but it would be ok for him to walk for exercise.   3. Pulmonary nodule: Lung fields on the recent cardiac CT showed a small (6 mm) lung nodule.  Patient never smoked.  He will need a noncontrast CT chest in 1 year to follow the nodule.   Marca Ancona 12/25/2012

## 2012-12-28 ENCOUNTER — Telehealth: Payer: Self-pay | Admitting: Cardiology

## 2012-12-28 ENCOUNTER — Ambulatory Visit: Payer: Medicare PPO | Admitting: Cardiology

## 2012-12-28 NOTE — Telephone Encounter (Signed)
New problem   Have a question concerns CT scan result. Please call Lauren

## 2012-12-28 NOTE — Telephone Encounter (Signed)
Spoke with Cordelia Pen at Dr [employer] office.

## 2012-12-28 NOTE — Telephone Encounter (Signed)
Billy Adams will re-fax 

## 2012-12-28 NOTE — Telephone Encounter (Signed)
°  They wanted to let you know they did not get the fax. Please call and advise.

## 2013-01-04 ENCOUNTER — Telehealth: Payer: Self-pay | Admitting: Cardiology

## 2013-01-04 ENCOUNTER — Encounter: Payer: Self-pay | Admitting: *Deleted

## 2013-01-04 DIAGNOSIS — I2510 Atherosclerotic heart disease of native coronary artery without angina pectoris: Secondary | ICD-10-CM

## 2013-01-04 DIAGNOSIS — Z789 Other specified health status: Secondary | ICD-10-CM

## 2013-01-04 DIAGNOSIS — I4949 Other premature depolarization: Secondary | ICD-10-CM

## 2013-01-04 NOTE — Telephone Encounter (Signed)
Pt is requesting to schedule cardiac cath 01/31/13.

## 2013-01-04 NOTE — Telephone Encounter (Signed)
LHC radial scheduled for 01/31/13 Dr Shirlee Latch.

## 2013-01-04 NOTE — Telephone Encounter (Signed)
New Problem:  Pt is requesting to speak to the nurse. States he will give more details when she returns his call.

## 2013-01-22 ENCOUNTER — Encounter (HOSPITAL_COMMUNITY): Payer: Self-pay | Admitting: Pharmacy Technician

## 2013-01-25 ENCOUNTER — Other Ambulatory Visit (INDEPENDENT_AMBULATORY_CARE_PROVIDER_SITE_OTHER): Payer: Medicare PPO

## 2013-01-25 DIAGNOSIS — I4949 Other premature depolarization: Secondary | ICD-10-CM

## 2013-01-25 DIAGNOSIS — I2510 Atherosclerotic heart disease of native coronary artery without angina pectoris: Secondary | ICD-10-CM

## 2013-01-25 DIAGNOSIS — Z789 Other specified health status: Secondary | ICD-10-CM

## 2013-01-25 LAB — CBC WITH DIFFERENTIAL/PLATELET
BASOS PCT: 0.5 % (ref 0.0–3.0)
Basophils Absolute: 0 10*3/uL (ref 0.0–0.1)
EOS ABS: 0.2 10*3/uL (ref 0.0–0.7)
EOS PCT: 4.6 % (ref 0.0–5.0)
HCT: 40 % (ref 39.0–52.0)
HEMOGLOBIN: 13.5 g/dL (ref 13.0–17.0)
Lymphocytes Relative: 35.2 % (ref 12.0–46.0)
Lymphs Abs: 1.5 10*3/uL (ref 0.7–4.0)
MCHC: 33.7 g/dL (ref 30.0–36.0)
MCV: 90.8 fl (ref 78.0–100.0)
MONO ABS: 0.4 10*3/uL (ref 0.1–1.0)
Monocytes Relative: 10.2 % (ref 3.0–12.0)
NEUTROS ABS: 2.2 10*3/uL (ref 1.4–7.7)
NEUTROS PCT: 49.5 % (ref 43.0–77.0)
Platelets: 166 10*3/uL (ref 150.0–400.0)
RBC: 4.41 Mil/uL (ref 4.22–5.81)
RDW: 13.1 % (ref 11.5–14.6)
WBC: 4.4 10*3/uL — AB (ref 4.5–10.5)

## 2013-01-25 LAB — BASIC METABOLIC PANEL
BUN: 8 mg/dL (ref 6–23)
CO2: 30 mEq/L (ref 19–32)
Calcium: 9 mg/dL (ref 8.4–10.5)
Chloride: 103 mEq/L (ref 96–112)
Creatinine, Ser: 0.9 mg/dL (ref 0.4–1.5)
GFR: 86.45 mL/min (ref >60.00)
Glucose, Bld: 89 mg/dL (ref 70–99)
Potassium: 4.6 mEq/L (ref 3.5–5.1)
Sodium: 139 mEq/L (ref 135–145)

## 2013-01-25 LAB — LIPID PANEL
CHOLESTEROL: 83 mg/dL (ref 0–200)
HDL: 32.7 mg/dL — ABNORMAL LOW (ref >39.00)
LDL Cholesterol: 42 mg/dL (ref 0–99)
Total CHOL/HDL Ratio: 3
Triglycerides: 43 mg/dL (ref 0.0–149.0)
VLDL: 8.6 mg/dL (ref 0.0–40.0)

## 2013-01-25 LAB — PROTIME-INR
INR: 1.2 ratio — AB (ref 0.8–1.0)
Prothrombin Time: 12.4 s (ref 10.2–12.4)

## 2013-01-30 ENCOUNTER — Other Ambulatory Visit: Payer: Self-pay | Admitting: Cardiology

## 2013-01-31 ENCOUNTER — Encounter (HOSPITAL_COMMUNITY): Payer: Self-pay | Admitting: General Practice

## 2013-01-31 ENCOUNTER — Ambulatory Visit (HOSPITAL_COMMUNITY)
Admission: RE | Admit: 2013-01-31 | Discharge: 2013-02-01 | Disposition: A | Discharge status: Home / Self Care | Payer: Medicare PPO | Source: Ambulatory Visit | Attending: Cardiology | Admitting: Cardiology

## 2013-01-31 ENCOUNTER — Encounter (HOSPITAL_COMMUNITY)
Admission: RE | Disposition: A | Discharge status: Home / Self Care | Payer: Self-pay | Source: Ambulatory Visit | Attending: Cardiology

## 2013-01-31 DIAGNOSIS — I2510 Atherosclerotic heart disease of native coronary artery without angina pectoris: Secondary | ICD-10-CM | POA: Insufficient documentation

## 2013-01-31 DIAGNOSIS — Z955 Presence of coronary angioplasty implant and graft: Secondary | ICD-10-CM

## 2013-01-31 DIAGNOSIS — M19079 Primary osteoarthritis, unspecified ankle and foot: Secondary | ICD-10-CM | POA: Insufficient documentation

## 2013-01-31 DIAGNOSIS — I200 Unstable angina: Secondary | ICD-10-CM | POA: Insufficient documentation

## 2013-01-31 DIAGNOSIS — I4949 Other premature depolarization: Secondary | ICD-10-CM | POA: Insufficient documentation

## 2013-01-31 DIAGNOSIS — I714 Abdominal aortic aneurysm, without rupture, unspecified: Secondary | ICD-10-CM | POA: Insufficient documentation

## 2013-01-31 DIAGNOSIS — E785 Hyperlipidemia, unspecified: Secondary | ICD-10-CM | POA: Insufficient documentation

## 2013-01-31 DIAGNOSIS — I493 Ventricular premature depolarization: Secondary | ICD-10-CM | POA: Diagnosis present

## 2013-01-31 DIAGNOSIS — R9439 Abnormal result of other cardiovascular function study: Secondary | ICD-10-CM

## 2013-01-31 DIAGNOSIS — I10 Essential (primary) hypertension: Secondary | ICD-10-CM | POA: Insufficient documentation

## 2013-01-31 DIAGNOSIS — E7800 Pure hypercholesterolemia, unspecified: Secondary | ICD-10-CM | POA: Insufficient documentation

## 2013-01-31 HISTORY — DX: Presence of coronary angioplasty implant and graft: Z95.5

## 2013-01-31 HISTORY — DX: Atherosclerotic heart disease of native coronary artery without angina pectoris: I25.10

## 2013-01-31 HISTORY — PX: LEFT HEART CATHETERIZATION WITH CORONARY ANGIOGRAM: SHX5451

## 2013-01-31 HISTORY — PX: PERCUTANEOUS CORONARY STENT INTERVENTION (PCI-S): SHX5485

## 2013-01-31 HISTORY — PX: CORONARY ANGIOPLASTY: SHX604

## 2013-01-31 HISTORY — PX: CARDIAC CATHETERIZATION: SHX172

## 2013-01-31 LAB — POCT ACTIVATED CLOTTING TIME
ACTIVATED CLOTTING TIME: 227 s
ACTIVATED CLOTTING TIME: 238 s

## 2013-01-31 SURGERY — LEFT HEART CATHETERIZATION WITH CORONARY ANGIOGRAM
Anesthesia: Moderate Sedation | Laterality: Left

## 2013-01-31 MED ORDER — LIDOCAINE HCL (PF) 1 % IJ SOLN
INTRAMUSCULAR | Status: AC
  Filled 2013-01-31: qty 30

## 2013-01-31 MED ORDER — SODIUM CHLORIDE 0.9 % IV SOLN
INTRAVENOUS | Status: DC
  Administered 2013-01-31: 09:00:00 via INTRAVENOUS

## 2013-01-31 MED ORDER — ONDANSETRON HCL 4 MG/2ML IJ SOLN: 4.0000 mg | Freq: Four times a day (QID) | INTRAMUSCULAR | Status: DC | PRN

## 2013-01-31 MED ORDER — HEPARIN (PORCINE) IN NACL 2-0.9 UNIT/ML-% IJ SOLN
INTRAMUSCULAR | Status: AC
Start: 2013-01-31 — End: 2013-01-31
  Filled 2013-01-31: qty 1000

## 2013-01-31 MED ORDER — ASPIRIN 81 MG PO CHEW
CHEWABLE_TABLET | ORAL | Status: AC
  Administered 2013-01-31: 09:00:00 81 mg via ORAL
  Filled 2013-01-31: qty 1

## 2013-01-31 MED ORDER — ATORVASTATIN CALCIUM 40 MG PO TABS
40.0000 mg | ORAL_TABLET | Freq: Every day | ORAL | Status: DC
  Administered 2013-01-31: 40 mg via ORAL
  Filled 2013-01-31 (×2): qty 1

## 2013-01-31 MED ORDER — ADENOSINE 12 MG/4ML IV SOLN
16.0000 mL | Freq: Once | INTRAVENOUS | Status: DC
  Filled 2013-01-31: qty 16

## 2013-01-31 MED ORDER — SODIUM CHLORIDE 0.9 % IV SOLN: 250.0000 mL | INTRAVENOUS | Status: DC | PRN

## 2013-01-31 MED ORDER — FENTANYL CITRATE 0.05 MG/ML IJ SOLN
INTRAMUSCULAR | Status: AC
  Filled 2013-01-31: qty 2

## 2013-01-31 MED ORDER — CLOPIDOGREL BISULFATE 75 MG PO TABS
75.0000 mg | ORAL_TABLET | Freq: Every day | ORAL | Status: DC
  Administered 2013-02-01: 09:00:00 75 mg via ORAL
  Filled 2013-01-31: qty 1

## 2013-01-31 MED ORDER — ACETAMINOPHEN 325 MG PO TABS: 650.0000 mg | ORAL_TABLET | ORAL | Status: DC | PRN

## 2013-01-31 MED ORDER — ALPRAZOLAM 0.25 MG PO TABS: 0.2500 mg | ORAL_TABLET | Freq: Every evening | ORAL | Status: DC | PRN

## 2013-01-31 MED ORDER — SODIUM CHLORIDE 0.9 % IJ SOLN: 3.0000 mL | INTRAMUSCULAR | Status: DC | PRN

## 2013-01-31 MED ORDER — ASPIRIN 81 MG PO CHEW
CHEWABLE_TABLET | ORAL | Status: AC
  Filled 2013-01-31: qty 1

## 2013-01-31 MED ORDER — MIDAZOLAM HCL 2 MG/2ML IJ SOLN
INTRAMUSCULAR | Status: AC
  Filled 2013-01-31: qty 2

## 2013-01-31 MED ORDER — ASPIRIN 81 MG PO CHEW
81.0000 mg | CHEWABLE_TABLET | ORAL | Status: AC
  Administered 2013-01-31: 81 mg via ORAL

## 2013-01-31 MED ORDER — SODIUM CHLORIDE 0.9 % IJ SOLN: 3.0000 mL | Freq: Two times a day (BID) | INTRAMUSCULAR | Status: DC

## 2013-01-31 MED ORDER — ASPIRIN 81 MG PO CHEW
81.0000 mg | CHEWABLE_TABLET | Freq: Every day | ORAL | Status: DC
  Administered 2013-02-01: 09:00:00 81 mg via ORAL
  Filled 2013-01-31: qty 1

## 2013-01-31 MED ORDER — NITROGLYCERIN 0.2 MG/ML ON CALL CATH LAB
INTRAVENOUS | Status: AC
  Filled 2013-01-31: qty 1

## 2013-01-31 MED ORDER — VERAPAMIL HCL 2.5 MG/ML IV SOLN
INTRAVENOUS | Status: AC
  Filled 2013-01-31: qty 2

## 2013-01-31 MED ORDER — SODIUM CHLORIDE 0.9 % IV SOLN
INTRAVENOUS | Status: AC
Start: 2013-01-31 — End: 2013-01-31

## 2013-01-31 MED ORDER — ASPIRIN 81 MG PO CHEW
CHEWABLE_TABLET | ORAL | Status: AC
Start: 2013-01-31 — End: 2013-01-31
  Filled 2013-01-31: qty 1

## 2013-01-31 MED ORDER — CLOPIDOGREL BISULFATE 300 MG PO TABS
ORAL_TABLET | ORAL | Status: AC
  Filled 2013-01-31: qty 1

## 2013-01-31 NOTE — H&P (Addendum)
Physician History and Physical    Billy Adams MRN: [id number] DOB/AGE: 09-22-1945 68 y.o. Admit date: 01/31/2013  HPI:  68 yo with history of PVCs had intermediate risk ETT and concern for significant LAD stenosis on coronary CTA.  Stable with no chest pain but palpitations from frequent PVCs.    Review of systems complete and found to be negative unless listed above   Past Medical History  Diagnosis Date  . Detached retina 2003  . PVC's (premature ventricular contractions)     Holter monitor 07/27/12 showed frequent PVCs, representing about 7% of his beats. Has improved since we reduced his caffeine.  . Reflux esophagitis 2004    Improved over last year  . Prostate nodule 2009    Seeing Dr. Laverle Patter. Had another biopsy last year wich was benign. PSAs have been stable.  . Sleeping difficulties 2002    2-3 nights a wk. Takes Xanax & Melatonin PRN.  . Impaired glucose tolerance 2011  . Arthralgia of foot     bilateral  . BPH (benign prostatic hypertrophy) 1999  . Palpitations     Pt has noticed an increase of palps lately, especially after exercise  . Left cataract   . Detached retina     When right cataract was repaired, so Dr. Luciana Axe has recommened delaying the left side repair longer so there will be less chance of detachedment of the retina again.  . Degenerative disk disease     of cervical spine w/occasional radicular symptoms since 1985  . Hypercholesterolemia   . Hyperlipidemia 1995  . Osteoarthritis of both feet   . Varicose vein   . Hypertension   . Lactose intolerance   . AAA (abdominal aortic aneurysm)      Family History  Problem Relation Age of Onset  . Coronary artery disease Father   . Heart attack Father   . Heart disease Father     rheumatic heart disease  . CVA Mother   . Atrial fibrillation Mother 5  . Bipolar disorder Sister   . Seasonal affective disorder Sister   . Anxiety disorder Sister     History   Social History  . Marital Status:  Married    Spouse Name: N/A    Number of Children: N/A  . Years of Education: N/A   Occupational History  . Not on file.   Social History Main Topics  . Smoking status: Never Smoker   . Smokeless tobacco: Never Used  . Alcohol Use: Yes     Comment: occasionally, 2-3 a month  . Drug Use: No  . Sexual Activity: Not on file   Other Topics Concern  . Not on file   Social History Narrative  . No narrative on file     Prescriptions prior to admission  Medication Sig Dispense Refill  . ALPRAZolam (XANAX) 0.25 MG tablet Take 0.25 mg by mouth at bedtime as needed for sleep. Take 1/2 tablet at night as needed for sleep a few times a month      . ARTIFICIAL TEAR OP Apply 1 drop to eye daily as needed (for dry eyes).      Marland Kitchen aspirin 81 MG tablet Take 81 mg by mouth daily.      Marland Kitchen atorvastatin (LIPITOR) 40 MG tablet Take 1 tablet (40 mg total) by mouth daily.  90 tablet  3  . calcium citrate-vitamin D 500-400 MG-UNIT chewable tablet Chew 1 tablet by mouth daily.      Marland Kitchen GRAPE SEED  CR PO Take 2 capsules by mouth daily. Muscadine grape seed (resveratol)      . Melatonin 3 MG TABS Take 1 tablet by mouth at bedtime as needed (for sleep).       . Multiple Vitamins-Minerals (MULTIVITAMIN WITH MINERALS) tablet Take 1 tablet by mouth daily.      Marland Kitchen. loratadine (CLARITIN) 10 MG tablet Take 10 mg by mouth as needed for allergies.        Physical Exam: Blood pressure 144/79, pulse 66, temperature 97.9 F (36.6 C), temperature source Oral, resp. rate 20, height 6' 3.5" (1.918 m), weight 78.472 kg (173 lb), SpO2 99.00%.  General: NAD Neck: No JVD, no thyromegaly or thyroid nodule.  Lungs: Clear to auscultation bilaterally with normal respiratory effort. CV: Nondisplaced PMI.  Heart regular S1/S2, no S3/S4, no murmur.  No peripheral edema.  No carotid bruit.  Normal pedal pulses.  Abdomen: Soft, nontender, no hepatosplenomegaly, no distention.  Skin: Intact without lesions or rashes.  Neurologic: Alert  and oriented x 3.  Psych: Normal affect. Extremities: No clubbing or cyanosis.  HEENT: Normal.   Labs:   Lab Results  Component Value Date   WBC 4.4* 01/25/2013   HGB 13.5 01/25/2013   HCT 40.0 01/25/2013   MCV 90.8 01/25/2013   PLT 166.0 01/25/2013    Recent Labs Lab 01/25/13 0920  NA 139  K 4.6  CL 103  CO2 30  BUN 8  CREATININE 0.9  CALCIUM 9.0  GLUCOSE 89   No results found for this basename: CKTOTAL, CKMB, CKMBINDEX, TROPONINI    Lab Results  Component Value Date   CHOL 83 01/25/2013   Lab Results  Component Value Date   HDL 32.70* 01/25/2013   Lab Results  Component Value Date   LDLCALC 42 01/25/2013   Lab Results  Component Value Date   TRIG 43.0 01/25/2013   Lab Results  Component Value Date   CHOLHDL 3 01/25/2013   No results found for this basename: LDLDIRECT     ASSESSMENT AND PLAN: Left heart cath today.   Signed: Marca AnconaDalton Malacai Grantz 01/31/2013, 10:10 AM    Cath Lab Visit (complete for each Cath Lab visit)  Clinical Evaluation Leading to the Procedure:   ACS: no  Non-ACS:    Anginal Classification: CCS I  Anti-ischemic medical therapy: Minimal Therapy (1 class of medications)  Non-Invasive Test Results: High-risk stress test findings: cardiac mortality >3%/year  Prior CABG: No previous CABG

## 2013-01-31 NOTE — Progress Notes (Signed)
TR BAND REMOVAL  LOCATION:    right radial  DEFLATED PER PROTOCOL:    yes  TIME BAND OFF / DRESSING APPLIED:    1430   SITE UPON ARRIVAL:    Level 0  SITE AFTER BAND REMOVAL:    Level 0  REVERSE ALLEN'S TEST:     positive  CIRCULATION SENSATION AND MOVEMENT:    Within Normal Limits   yes  COMMENTS:   Rechecked site at 1500 and dressing remains dry and intact, csms wnls and radial and ulnar pulses present/palpable.

## 2013-01-31 NOTE — CV Procedure (Signed)
     Cardiac Catheterization Operative Report  Rod Zunker [id number] 1/14/201511:34 AM Carolyne FiscalBLOMGREN,PETER F, MD  Procedure Performed:  1. Fractional flow reserve of the LAD 2. PTCA/DES x 2 proximal LAD   Operator: Verne Carrowhristopher McAlhany, MD  Indication:  68 yo male with recent exertional ventricular ectopy, frequent PVCs. Noted to have severe stenosis proximal LAD on coronary CTA. Dr. Shirlee LatchMcLean performed a an exercise stress test and he had significant ST depression with exercise. High risk stress test. Diagnostic cath this am per Dr. Shirlee LatchMcLean with severe stenosis proximal LAD.                                     Procedure Details: The risks, benefits, complications, treatment options, and expected outcomes were discussed with the patient. The patient and/or family concurred with the proposed plan, giving informed consent. When I entered the case there was a 5/6 [place] sheath present in the right radial artery. ACT was 220. Additional heparin was given. AI engaged the left main with a XB LAD 3.5 guiding catheter. A pressure wire was advanced down the LAD. Baseline FFR was 0.93. With infusion of IV adenosine, FFR was 0.80-0.81. Given the high risk stress test, severe LAD stenosis in the proximal segment and FFR of 0.80, I elected to perform PCI of the proximal LAD. He was given 600 mg Plavix po x 1. An additional ASA 243 mg was given. A 2.5 x 15 mm balloon was used to pre-dilate the stenosis. I then deployed a 3.0 x 16 mm Promus Premier DES in the proximal LAD. Just beyond the stent was moderate plaque with a hazy appearance. I decided to cover this area. A 2.75 x 12 mm Promus Premier DES was deployed distal to the first stent  in the proximal to mid LAD overlapping the first stent. The stents were post-dilated with a 3.0 x 15 mm Uehling balloon x 3. Final angiography demonstrated an excellent angiographic result. The stenosis was taken from 80% down to 0%.   There were no immediate complications. The patient  was taken to the recovery area in stable condition.   Impression: 1. Severe stenosis proximal LAD with high risk stress tests, exertional PVCS felt to anginal equivalent 2. Successful PTCA/DES x 2 proximal LAD  Recommendations: ASA and Plavix for at least one year but longer if he tolerates. Continue statin. Discharge in am. Follow up with Dr. Shirlee LatchMcLean 2-3 weeks.        Complications:  None; patient tolerated the procedure well.

## 2013-01-31 NOTE — CV Procedure (Addendum)
    Cardiac Catheterization Procedure Note  Name: Billy Adams MRN: [id number] DOB: Dec 14, 1945  Procedure: Left Heart Cath, Selective Coronary Angiography, LV angiography  Indication: Abnormal ETT, abnormal coronary CTA, frequent symptomatic PVCs with exercise.    Procedural Details: The right wrist was prepped, draped, and anesthetized with 1% lidocaine. Using the modified Seldinger technique, a 5 French sheath was introduced into the right radial artery. 3 mg of verapamil was administered through the sheath, weight-based unfractionated heparin was administered intravenously. Standard Judkins catheters were used for selective coronary angiography and left ventriculography. Catheter exchanges were performed over an exchange length guidewire. There were no immediate procedural complications.  [facility] Cl[facility]ton JamesMcAlhany.  Procedural Findings: Hemodynamics: AO 118/59 LV 119/10  Coronary angiography: Coronary dominance: right  Left mainstem: Calcified left main with 40% distal stenosis.   Left anterior descending (LAD): Heavy calcification in the proximal LAD. Long up to 80% proximal LAD stenosis.  60% mid LAD stenosis.   Left circumflex (LCx): Large vessel.  30% proximal stenosis.   Right coronary artery (RCA): Luminal irregularities.   Left ventriculography: Not done given recent echo.  Final Conclusions:  There was an 80% proximal LAD stenosis and a 60% mid LAD stenosis.  The proximal LAD stenosis corresponds to the disease seen on coronary CTA.  I do think that the patient gets exertional ischemia given the very frequent PVCs with exercise and the abnormal exercise ECG with significant ST depression.  I discussed the case with Dr. Clifton JamesMcAlhany.  He will do FFR to the proximal LAD, we will plan PCI based upon the results.   Marca AnconaDalton Lashawnda Hancox 01/31/2013, 10:51 AM

## 2013-02-01 ENCOUNTER — Encounter (HOSPITAL_COMMUNITY): Payer: Self-pay | Admitting: Cardiology

## 2013-02-01 DIAGNOSIS — Z955 Presence of coronary angioplasty implant and graft: Secondary | ICD-10-CM

## 2013-02-01 DIAGNOSIS — R9439 Abnormal result of other cardiovascular function study: Secondary | ICD-10-CM

## 2013-02-01 HISTORY — DX: Presence of coronary angioplasty implant and graft: Z95.5

## 2013-02-01 LAB — CBC
HCT: 37.4 % — ABNORMAL LOW (ref 39.0–52.0)
HEMOGLOBIN: 12.8 g/dL — AB (ref 13.0–17.0)
MCH: 31.4 pg (ref 26.0–34.0)
MCHC: 34.2 g/dL (ref 30.0–36.0)
MCV: 91.9 fL (ref 78.0–100.0)
PLATELETS: 145 10*3/uL — AB (ref 150–400)
RBC: 4.07 MIL/uL — AB (ref 4.22–5.81)
RDW: 13.1 % (ref 11.5–15.5)
WBC: 6.1 10*3/uL (ref 4.0–10.5)

## 2013-02-01 LAB — BASIC METABOLIC PANEL
BUN: 7 mg/dL (ref 6–23)
CO2: 24 mEq/L (ref 19–32)
Calcium: 8.8 mg/dL (ref 8.4–10.5)
Chloride: 102 mEq/L (ref 96–112)
Creatinine, Ser: 0.77 mg/dL (ref 0.50–1.35)
GLUCOSE: 88 mg/dL (ref 70–99)
POTASSIUM: 4.8 meq/L (ref 3.7–5.3)
Sodium: 139 mEq/L (ref 137–147)

## 2013-02-01 MED ORDER — NITROGLYCERIN 0.4 MG SL SUBL: 0.4000 mg | SUBLINGUAL_TABLET | SUBLINGUAL | Status: DC | PRN

## 2013-02-01 MED ORDER — CLOPIDOGREL BISULFATE 75 MG PO TABS: 75.0000 mg | ORAL_TABLET | Freq: Every day | ORAL | Status: DC

## 2013-02-01 NOTE — Discharge Summary (Signed)
Physician Discharge Summary       Patient ID: Billy Adams MRN: [id number] DOB/AGE: [birth year] 70 y.o.  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 Shirlee LatchMclean  01/31/2013 Successful PTCA/DES x 2 proximal LAD with Promus Premier DESs.  By Dr. Clifton JamesMcAlhany.   Hospital Course: 68 yo with history of PVCs had intermediate risk ETT and concern for significant LAD stenosis on coronary CTA. Stable with no chest pain but palpitations from frequent PVCs.  Pt presented electively for procedure.  Found to have heavy calcification in the proximal LAD, Long up to 80% proximal LAD stenosis, 60% mid LAD stenosis underwent successful PTCA/DES x 2 proximal LAD with promus premier stents.  Post procedure without problems or complaints, does have an occ PVC.  Was seen and evaluated by Dr. Shirlee LatchMclean and found stable for discharge with Plavix and asa.  Ok to add CoQ10 as well.        Consults: None  Significant Diagnostic Studies:  BMET    Component Value Date/Time   NA 139 02/01/2013 0457   K 4.8 02/01/2013 0457   CL 102 02/01/2013 0457   CO2 24 02/01/2013 0457   GLUCOSE 88 02/01/2013 0457   BUN 7 02/01/2013 0457   CREATININE 0.77 02/01/2013 0457   CALCIUM 8.8 02/01/2013 0457   GFRNONAA >90 02/01/2013 0457   GFRAA >90 02/01/2013 0457    CBC    Component Value Date/Time   WBC 6.1 02/01/2013 0457   RBC 4.07* 02/01/2013 0457   HGB 12.8* 02/01/2013 0457   HCT 37.4* 02/01/2013 0457   PLT 145* 02/01/2013 0457   MCV 91.9 02/01/2013 0457   MCH 31.4 02/01/2013 0457   MCHC 34.2 02/01/2013 0457   RDW 13.1 02/01/2013 0457   LYMPHSABS 1.5  01/25/2013 0920   MONOABS 0.4 01/25/2013 0920   EOSABS 0.2 01/25/2013 0920   BASOSABS 0.0 01/25/2013 0920       Discharge Exam: Blood pressure 139/76, pulse 72, temperature 98.7 F (37.1 C), temperature source Oral, resp. rate 18, height 6' 3.5" (1.918 m), weight 170 lb 3.1 oz (77.2 kg), SpO2 99.00%.   AM exam:  PHYSICAL EXAM  General: NAD  Neck: No JVD, no thyromegaly or thyroid nodule.  Lungs: Clear to auscultation bilaterally with normal respiratory effort.  CV: Nondisplaced PMI. Heart regular S1/S2, no S3/S4, no murmur. No peripheral edema. No carotid bruit. Normal pedal pulses.  Abdomen: Soft, nontender, no hepatosplenomegaly, no distention.  Neurologic: Alert and oriented x 3.  Psych: Normal affect.  Extremities: No clubbing or cyanosis. Radial cath site benign  Disposition: 01-Home or Self Care   Future Appointments Provider Department Dept Phone   02/15/2013 9:50 AM Beatrice LecherScott T Weaver, PA-C [facility] [address] Office [phone]       Medication List         ALPRAZolam 0.25 MG tablet  Commonly known as:  XANAX  Take 0.25 mg by mouth at bedtime as needed for sleep. Take 1/2 tablet at night as needed for sleep a few times a month     ARTIFICIAL TEAR OP  Apply 1 drop to eye daily as needed (for dry eyes).  aspirin 81 MG tablet  Take 81 mg by mouth daily.     atorvastatin 40 MG tablet  Commonly known as:  LIPITOR  Take 1 tablet (40 mg total) by mouth daily.     calcium citrate-vitamin D 500-400 MG-UNIT chewable tablet  Chew 1 tablet by mouth daily.     clopidogrel 75 MG tablet  Commonly known as:  PLAVIX  Take 1 tablet (75 mg total) by mouth daily with breakfast.     GRAPE SEED CR PO  Take 2 capsules by mouth daily. Muscadine grape seed (resveratol)     loratadine 10 MG tablet  Commonly known as:  CLARITIN  Take 10 mg by mouth as needed for allergies.     Melatonin 3 MG Tabs  Take 1 tablet by mouth at bedtime as needed (for sleep).     multivitamin with  minerals tablet  Take 1 tablet by mouth daily.     nitroGLYCERIN 0.4 MG SL tablet  Commonly known as:  NITROSTAT  Place 1 tablet (0.4 mg total) under the tongue every 5 (five) minutes as needed for chest pain.           Follow-up Information   Follow up with Marca Ancona, MD On 02/06/2013. (at 9:50AM with Tereso Newcomer, PA for Dr. Alford Highland PA)    Specialty:  Cardiology   Contact information:   [address] [place] [address] [phone]        Discharge Instructions: Call [facility] [phone] if any bleeding, swelling or drainage at cath site.  May shower, no tub baths for 48 hours for groin sticks. No lifting over 5 pounds for 3 days, no driving for 3 days.    Heart Healthy diet.  Take 1 NTG, under your tongue, while sitting.  If no relief of pain may repeat NTG, one tab every 5 minutes up to 3 tablets total over 15 minutes.  If no relief CALL 911.  If you have dizziness/lightheadness  while taking NTG, stop taking and call 911.        You may take COQ10 100 mg with the statin.  Signed: Leone Brand Nurse Practitioner-Certified [facility]: [facility] 02/01/2013, 8:51 AM  Time spent on discharge :>30 min with NP and MD time.

## 2013-02-01 NOTE — Progress Notes (Signed)
Patient ID: Billy Adams, male   DOB: 07-28-1945, 68 y.o.   MRN: [id number]   SUBJECTIVE: No problems overnight.  Occasional PVCs on monitor.    Marland Kitchen. aspirin  81 mg Oral Daily  . atorvastatin  40 mg Oral q1800  . clopidogrel  75 mg Oral Q breakfast      Filed Vitals:   01/31/13 2017 01/31/13 2359 02/01/13 0434 02/01/13 0722  BP: 137/75 120/56 119/67 139/76  Pulse: 62 53 56 72  Temp: 97.7 F (36.5 C) 98 F (36.7 C) 98.1 F (36.7 C) 98.7 F (37.1 C)  TempSrc: Oral Oral Oral Oral  Resp:    18  Height:      Weight:   77.2 kg (170 lb 3.1 oz)   SpO2: 100% 95% 98% 99%    Intake/Output Summary (Last 24 hours) at 02/01/13 0805 Last data filed at 01/31/13 1909  Gross per 24 hour  Intake  802.5 ml  Output      0 ml  Net  802.5 ml    LABS: Basic Metabolic Panel:  Recent Labs  40/98/1101/15/15 0457  NA 139  K 4.8  CL 102  CO2 24  GLUCOSE 88  BUN 7  CREATININE 0.77  CALCIUM 8.8   Liver Function Tests: No results found for this basename: AST, ALT, ALKPHOS, BILITOT, PROT, ALBUMIN,  in the last 72 hours No results found for this basename: LIPASE, AMYLASE,  in the last 72 hours CBC:  Recent Labs  02/01/13 0457  WBC 6.1  HGB 12.8*  HCT 37.4*  MCV 91.9  PLT 145*   Cardiac Enzymes: No results found for this basename: CKTOTAL, CKMB, CKMBINDEX, TROPONINI,  in the last 72 hours BNP: No components found with this basename: POCBNP,  D-Dimer: No results found for this basename: DDIMER,  in the last 72 hours Hemoglobin A1C: No results found for this basename: HGBA1C,  in the last 72 hours Fasting Lipid Panel: No results found for this basename: CHOL, HDL, LDLCALC, TRIG, CHOLHDL, LDLDIRECT,  in the last 72 hours Thyroid Function Tests: No results found for this basename: TSH, T4TOTAL, FREET3, T3FREE, THYROIDAB,  in the last 72 hours Anemia Panel: No results found for this basename: VITAMINB12, FOLATE, FERRITIN, TIBC, IRON, RETICCTPCT,  in the last 72 hours  RADIOLOGY: No  results found.  PHYSICAL EXAM General: NAD Neck: No JVD, no thyromegaly or thyroid nodule.  Lungs: Clear to auscultation bilaterally with normal respiratory effort. CV: Nondisplaced PMI.  Heart regular S1/S2, no S3/S4, no murmur.  No peripheral edema.  No carotid bruit.  Normal pedal pulses.  Abdomen: Soft, nontender, no hepatosplenomegaly, no distention.  Neurologic: Alert and oriented x 3.  Psych: Normal affect. Extremities: No clubbing or cyanosis. Radial cath site benign.   TELEMETRY: Reviewed telemetry pt in NSR with occasional PVCs  ASSESSMENT AND PLAN: 68 yo with abnormal ETT and coronary CTA who has history of severe PVCs with exercise was noted yesterday on LHC to have long 80% proximal LAD stenosis.  He had DES x 2 to the proximal LAD.  No problems overnight.  - He may go home this morning.  Followup with me in 2 wks.  - Cardiac rehab as outpatient.  - Continue home meds with addition of clopidogrel 75 mg daily.  I suggested that he take coenzyme Q10 200 mg daily along with his statin.   Billy Adams 02/01/2013 8:07 AM

## 2013-02-01 NOTE — Discharge Instructions (Signed)
[facility] [address] [phone] if any bleeding, swelling or drainage at cath site.  May shower, no tub baths for 48 hours for groin sticks. No lifting over 5 pounds for 3 days, no driving for 3 days.    Heart Healthy diet.  Take 1 NTG, under your tongue, while sitting.  If no relief of pain may repeat NTG, one tab every 5 minutes up to 3 tablets total over 15 minutes.  If no relief CALL 911.  If you have dizziness/lightheadness  while taking NTG, stop taking and call 911.        You may take COQ10 100 mg with the statin.

## 2013-02-01 NOTE — Progress Notes (Signed)
[id number] Cardiac Rehab Pt walked independently in hall, denies any cp or SOB. Completed stent education with pt. He voices understanding.Pt agrees to Outpt. CRP in [place], will send referral. Beatrix FettersHughes, Lenee Franze G, RN 02/01/2013 9:40 AM

## 2013-02-06 ENCOUNTER — Telehealth: Payer: Self-pay | Admitting: Cardiology

## 2013-02-06 NOTE — Telephone Encounter (Signed)
Confirmed time of p hosp appt with Scott.

## 2013-02-06 NOTE — Telephone Encounter (Signed)
New message   Patient has some questions regarding discharge instruction.

## 2013-02-14 NOTE — Progress Notes (Signed)
[address] [place], [place]  [address] Phone: [phone] Fax:  [phone]  Date:  02/15/2013   ID:  Billy Adams, DOB [birth year], MRN [id number]  PCP:  Carolyne FiscalBLOMGREN,PETER F, MD  Cardiologist:  Dr. Marca Anconaalton McLean     History of Present Illness: Rod Mandel is a 68 y.o. male with a hx of PVCs and CAD. Since high school, the patient can remember feeling palpitations with stressful events (public speaking, band solo, etc).  Patient noted irregularity in pulse after exercise and his PCP arranged a holter monitor in 7/14 that showed frequent PVCs (7% of total complexes). He cut out caffeine out almost entirely but continued to have PVCs with and after exercise. Patient has good exercise tolerance. He walks and works out at [employer] though not regularly. No exertional dyspnea or chest pain. No lightheadedness or syncope.  He had an echocardiogram and an ETT. The echo showed normal LV and RV size and function. The ETT showed good exercise tolerance, but he had frequent PVCs including bigeminy with exercise and in recovery. He also was noted to have significant ST depression with exercise. Therefore, he underwent a coronary CT angiogram. This showed quite extensive plaque in his coronaries. The proximal LAD was especially involved, and there was concern for moderate to severe proximal LAD stenosis.  He last saw Dr. Marca Anconaalton McLean who recommeded proceeding with LHC.  LHC (01/31/2013):  dLM 40, pLAD 80, mLAD 60, pCFX 30.  FFR of LAD 0.80.  PCI:  Promus Premier DES x 2 to proximal LAD.  He is doing well since d/c.  The patient denies chest pain, shortness of breath, syncope, orthopnea, PND or significant pedal edema.   Recent Labs: 01/25/2013: HDL Cholesterol 32.70*; LDL (calc) 42  02/01/2013: Creatinine 0.77; Hemoglobin 12.8*; Potassium 4.8   Wt Readings from Last 3 Encounters:  02/15/13 176 lb (79.833 kg)  02/01/13 170 lb 3.1 oz (77.2 kg)  02/01/13 170 lb 3.1 oz (77.2 kg)    Past Medical  History: 1. 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 Echo (11/14) with EF 55-60%, no significant valvular abnormalities.  2. GERD 3. Hyperlipidemia  4. BPH  5. History of prostate nodule  6. Venous varicosities  7. Degenerative disc disease  8. Impaired fasting glucose  9. CAD: ETT (11/14) with 10:05 exercise, 1.5 mm ST depression V4-V6 and in the inferior leads, frequent PVCs including bigeminy with exercise, PVCs in recovery. Coronary CT angiography (11/14) with calcium score 1659 Agatston units (95th percentile for age/gender), moderate to severe proximal LAD stenosis.    Past Medical History  Diagnosis Date  . Detached retina 2003  . PVC's (premature ventricular contractions)     Holter monitor 07/27/12 showed frequent PVCs, representing about 7% of his beats. Has improved since we reduced his caffeine.  . Reflux esophagitis 2004    Improved over last year  . Prostate nodule 2009    Seeing Dr. Laverle PatterBorden. Had another biopsy last year wich was benign. PSAs have been stable.  . Sleeping difficulties 2002    2-3 nights a wk. Takes Xanax & Melatonin PRN.  . Impaired glucose tolerance 2011  . Arthralgia of foot     bilateral  . BPH (benign prostatic hypertrophy) 1999  . Palpitations     Pt has noticed an increase of palps lately, especially after exercise  . Left cataract   . Detached retina     When right cataract was repaired, so Dr.  Rankin has recommened delaying the left side repair longer so there will be less chance of detachedment of the retina again.  . Degenerative disk disease     of cervical spine w/occasional radicular symptoms since 1985  . Hypercholesterolemia   . Hyperlipidemia 1995  . Osteoarthritis of both feet   . Varicose vein   . Hypertension   . Lactose intolerance   . AAA (abdominal aortic aneurysm)   . Coronary artery disease     DES TO LAD X2  . CAD (coronary artery disease), residual post PCI with 40% LM, 30% LCX  12/25/2012  . S/P coronary artery stent placement -Prox LAD, DES 01/31/13  02/01/2013    Current Outpatient Prescriptions  Medication Sig Dispense Refill  . ALPRAZolam (XANAX) 0.25 MG tablet Take 0.25 mg by mouth at bedtime as needed for sleep. Take 1/2 tablet at night as needed for sleep a few times a month      . ARTIFICIAL TEAR OP Apply 1 drop to eye daily as needed (for dry eyes).      Marland Kitchen aspirin 81 MG tablet Take 81 mg by mouth daily.      Marland Kitchen atorvastatin (LIPITOR) 40 MG tablet Take 1 tablet (40 mg total) by mouth daily.  90 tablet  3  . calcium citrate-vitamin D 500-400 MG-UNIT chewable tablet Chew 1 tablet by mouth daily.      . clopidogrel (PLAVIX) 75 MG tablet Take 1 tablet (75 mg total) by mouth daily with breakfast.  30 tablet  11  . Coenzyme Q10 (CO Q 10) 100 MG CAPS Take 1 capsule by mouth daily.      Marland Kitchen GRAPE SEED CR PO Take 2 capsules by mouth daily. Muscadine grape seed (resveratol)      . loratadine (CLARITIN) 10 MG tablet Take 10 mg by mouth as needed for allergies.      . Melatonin 3 MG TABS Take 1 tablet by mouth at bedtime as needed (for sleep).       . Multiple Vitamins-Minerals (MULTIVITAMIN WITH MINERALS) tablet Take 1 tablet by mouth daily.      . nitroGLYCERIN (NITROSTAT) 0.4 MG SL tablet Place 1 tablet (0.4 mg total) under the tongue every 5 (five) minutes as needed for chest pain.  25 tablet  4   No current facility-administered medications for this visit.    Allergies:   Review of patient's allergies indicates no known allergies.   Social History:  The patient  reports that he has never smoked. He has never used smokeless tobacco. He reports that he drinks alcohol. He reports that he does not use illicit drugs.   Family History:  The patient's family history includes Anxiety disorder in his sister; Atrial fibrillation (age of onset: 42) in his mother; Bipolar disorder in his sister; CVA in his mother; Coronary artery disease in his father; Heart attack in his father;  Heart disease in his father; Seasonal affective disorder in his sister.   ROS:  Please see the history of present illness.      All other systems reviewed and negative.   PHYSICAL EXAM: VS:  BP 134/81  Pulse 66  Ht 6' 3.5" (1.918 m)  Wt 176 lb (79.833 kg)  BMI 21.70 kg/m2 Well nourished, well developed, in no acute distress HEENT: normal Neck: no JVD Cardiac:  normal S1, S2; RRR; no murmur Lungs:  clear to auscultation bilaterally, no wheezing, rhonchi or rales Abd: soft, nontender, no hepatomegaly Ext: no edemaright wrist without hematoma or  mass  Skin: warm and dry Neuro:  CNs 2-12 intact, no focal abnormalities noted  EKG:  NSR, HR 66, normal axis, nonspecific ST-T wave changes, no change from prior tracing     ASSESSMENT AND PLAN:  1. CAD:  No angina.  We discussed the importance of dual antiplatelet therapy. Continue statin. Refer to cardiac rehabilitation. 2. Hypertension:  Controlled. 3. Hyperlipidemia:  Patient is now on a plant-based diet. He would like to reduce his Lipitor to 20 mg daily. We discussed the current guidelines that indicate he should be on a moderate to high intensity statin. I discussed this further with Dr. Shirlee Latch. We will allow patient to reduce his Lipitor to 20 mg daily. He will have follow up lipids and LFTs in 6-8 weeks. 4. PVCs:  No evidence on ECG today.  He is not having any symptoms.   5. Disposition:  F/u with Dr. Marca Ancona in 2 mos.   Signed, Tereso Newcomer, PA-C  02/15/2013 10:09 AM

## 2013-02-15 ENCOUNTER — Encounter: Payer: Self-pay | Admitting: Physician Assistant

## 2013-02-15 ENCOUNTER — Ambulatory Visit (INDEPENDENT_AMBULATORY_CARE_PROVIDER_SITE_OTHER): Payer: Medicare PPO | Admitting: Physician Assistant

## 2013-02-15 VITALS — BP 134/81 | HR 66 | Ht 75.5 in | Wt 176.0 lb

## 2013-02-15 DIAGNOSIS — I10 Essential (primary) hypertension: Secondary | ICD-10-CM

## 2013-02-15 DIAGNOSIS — I2510 Atherosclerotic heart disease of native coronary artery without angina pectoris: Secondary | ICD-10-CM

## 2013-02-15 DIAGNOSIS — I4949 Other premature depolarization: Secondary | ICD-10-CM

## 2013-02-15 DIAGNOSIS — E785 Hyperlipidemia, unspecified: Secondary | ICD-10-CM

## 2013-02-15 DIAGNOSIS — I493 Ventricular premature depolarization: Secondary | ICD-10-CM

## 2013-02-15 MED ORDER — ATORVASTATIN CALCIUM 20 MG PO TABS: 20.0000 mg | ORAL_TABLET | Freq: Every day | ORAL | Status: DC

## 2013-02-15 MED ORDER — ATORVASTATIN CALCIUM 40 MG PO TABS: 20.0000 mg | ORAL_TABLET | Freq: Every day | ORAL | Status: DC

## 2013-02-15 NOTE — Patient Instructions (Addendum)
You have been referred to CARDIAC REHAB TO BE DONE AT [facility]  FOLLOW UP WITH DR. Shirlee LatchMCLEAN IN 6-8 WEEKS  YOU WILL NEED FASTING LIPID AND LIVER PANEL DONE IN 6-8 WEEKS  DECREASE LIPITOR TO 20 MG DAILY REFILL SENT IN TODAY FOR LIPITOR 20 # 90 X 3

## 2013-02-22 ENCOUNTER — Encounter (HOSPITAL_COMMUNITY)
Admission: RE | Admit: 2013-02-22 | Discharge: 2013-02-22 | Disposition: A | Discharge status: Home / Self Care | Payer: Medicare PPO | Source: Ambulatory Visit | Attending: Cardiology | Admitting: Cardiology

## 2013-02-22 DIAGNOSIS — I200 Unstable angina: Secondary | ICD-10-CM | POA: Insufficient documentation

## 2013-02-22 DIAGNOSIS — I10 Essential (primary) hypertension: Secondary | ICD-10-CM | POA: Insufficient documentation

## 2013-02-22 DIAGNOSIS — I714 Abdominal aortic aneurysm, without rupture, unspecified: Secondary | ICD-10-CM | POA: Insufficient documentation

## 2013-02-22 DIAGNOSIS — Z5189 Encounter for other specified aftercare: Secondary | ICD-10-CM | POA: Insufficient documentation

## 2013-02-22 DIAGNOSIS — E7800 Pure hypercholesterolemia, unspecified: Secondary | ICD-10-CM | POA: Insufficient documentation

## 2013-02-22 DIAGNOSIS — E785 Hyperlipidemia, unspecified: Secondary | ICD-10-CM | POA: Insufficient documentation

## 2013-02-22 DIAGNOSIS — I2510 Atherosclerotic heart disease of native coronary artery without angina pectoris: Secondary | ICD-10-CM | POA: Insufficient documentation

## 2013-02-22 DIAGNOSIS — I4949 Other premature depolarization: Secondary | ICD-10-CM | POA: Insufficient documentation

## 2013-02-22 DIAGNOSIS — Z9861 Coronary angioplasty status: Secondary | ICD-10-CM | POA: Insufficient documentation

## 2013-02-22 NOTE — Progress Notes (Signed)
Cardiac Rehab Medication Review by a Pharmacist  Does the patient  feel that his/her medications are working for him/her?  yes  Has the patient been experiencing any side effects to the medications prescribed?  no  Does the patient measure his/her own blood pressure or blood glucose at home?  no   Does the patient have any problems obtaining medications due to transportation or finances?   no  Understanding of regimen: excellent Understanding of indications: excellent Potential of compliance: excellent  Billy Adams, Billy Adams 02/22/2013 9:21 AM

## 2013-02-26 ENCOUNTER — Encounter (HOSPITAL_COMMUNITY)
Admission: RE | Admit: 2013-02-26 | Discharge: 2013-02-26 | Disposition: A | Discharge status: Home / Self Care | Payer: Medicare PPO | Source: Ambulatory Visit | Attending: Cardiology | Admitting: Cardiology

## 2013-02-26 NOTE — Progress Notes (Signed)
Pt started cardiac rehab today.  Pt tolerated light exercise without difficulty. Telemetry rhythm Sinus, PVC times one noted during exercise today. Vital signs stable. Will continue to monitor the patient throughout  the program.

## 2013-02-28 ENCOUNTER — Encounter (HOSPITAL_COMMUNITY): Admission: RE | Admit: 2013-02-28 | Payer: Medicare PPO | Source: Ambulatory Visit

## 2013-03-02 ENCOUNTER — Encounter (HOSPITAL_COMMUNITY)
Admission: RE | Admit: 2013-03-02 | Discharge: 2013-03-02 | Disposition: A | Discharge status: Home / Self Care | Payer: Medicare PPO | Source: Ambulatory Visit | Attending: Cardiology | Admitting: Cardiology

## 2013-03-05 ENCOUNTER — Encounter (HOSPITAL_COMMUNITY)
Admission: RE | Admit: 2013-03-05 | Discharge: 2013-03-05 | Disposition: A | Discharge status: Home / Self Care | Payer: Medicare PPO | Source: Ambulatory Visit | Attending: Cardiology | Admitting: Cardiology

## 2013-03-05 NOTE — Progress Notes (Signed)
Reviewed home exercise with pt today.  Pt plans to walk at home and [place] [employer] [place].  Reviewed THR, pulse, RPE, sign and symptoms, NTG use, and when to call 911 or MD.  Pt voiced understanding. Billy PierceJessica Hawkins, MA, ACSM RCEP

## 2013-03-07 ENCOUNTER — Encounter (HOSPITAL_COMMUNITY)
Admission: RE | Admit: 2013-03-07 | Discharge: 2013-03-07 | Disposition: A | Discharge status: Home / Self Care | Payer: Medicare PPO | Source: Ambulatory Visit | Attending: Cardiology | Admitting: Cardiology

## 2013-03-09 ENCOUNTER — Encounter (HOSPITAL_COMMUNITY)
Admission: RE | Admit: 2013-03-09 | Discharge: 2013-03-09 | Disposition: A | Discharge status: Home / Self Care | Payer: Medicare PPO | Source: Ambulatory Visit | Attending: Cardiology | Admitting: Cardiology

## 2013-03-12 ENCOUNTER — Encounter (HOSPITAL_COMMUNITY)
Admission: RE | Admit: 2013-03-12 | Discharge: 2013-03-12 | Disposition: A | Discharge status: Home / Self Care | Payer: Medicare PPO | Source: Ambulatory Visit | Attending: Cardiology | Admitting: Cardiology

## 2013-03-12 NOTE — Progress Notes (Signed)
Nutrition Note Spoke with pt. Pt would like an individual meeting. According to discussion, pt is following Dr. Riley KillEsselstyn's book "How to Prevent and Reverse Heart Disease." Pt has recently transitioned to a vegan diet and wants to make sure he is getting "everything I need." [facility]. Continue client-centered nutrition education by RD as part of interdisciplinary care.  Monitor and evaluate progress toward nutrition goal with team.  Mickle PlumbEdna Cara Aguino, M.Ed, RD, LDN, CDE 03/12/2013 3:57 PM

## 2013-03-14 ENCOUNTER — Encounter (HOSPITAL_COMMUNITY)
Admission: RE | Admit: 2013-03-14 | Discharge: 2013-03-14 | Disposition: A | Discharge status: Home / Self Care | Payer: Medicare PPO | Source: Ambulatory Visit | Attending: Cardiology | Admitting: Cardiology

## 2013-03-16 ENCOUNTER — Encounter (HOSPITAL_COMMUNITY)
Admission: RE | Admit: 2013-03-16 | Discharge: 2013-03-16 | Disposition: A | Discharge status: Home / Self Care | Payer: Medicare PPO | Source: Ambulatory Visit | Attending: Cardiology | Admitting: Cardiology

## 2013-03-19 ENCOUNTER — Encounter (HOSPITAL_COMMUNITY)
Admission: RE | Admit: 2013-03-19 | Discharge: 2013-03-19 | Disposition: A | Discharge status: Home / Self Care | Payer: Medicare PPO | Source: Ambulatory Visit | Attending: Cardiology | Admitting: Cardiology

## 2013-03-19 DIAGNOSIS — E7800 Pure hypercholesterolemia, unspecified: Secondary | ICD-10-CM | POA: Insufficient documentation

## 2013-03-19 DIAGNOSIS — Z5189 Encounter for other specified aftercare: Secondary | ICD-10-CM | POA: Insufficient documentation

## 2013-03-19 DIAGNOSIS — I200 Unstable angina: Secondary | ICD-10-CM | POA: Insufficient documentation

## 2013-03-19 DIAGNOSIS — I4949 Other premature depolarization: Secondary | ICD-10-CM | POA: Insufficient documentation

## 2013-03-19 DIAGNOSIS — Z9861 Coronary angioplasty status: Secondary | ICD-10-CM | POA: Insufficient documentation

## 2013-03-19 DIAGNOSIS — E785 Hyperlipidemia, unspecified: Secondary | ICD-10-CM | POA: Insufficient documentation

## 2013-03-19 DIAGNOSIS — I10 Essential (primary) hypertension: Secondary | ICD-10-CM | POA: Insufficient documentation

## 2013-03-19 DIAGNOSIS — I714 Abdominal aortic aneurysm, without rupture, unspecified: Secondary | ICD-10-CM | POA: Insufficient documentation

## 2013-03-19 DIAGNOSIS — I2510 Atherosclerotic heart disease of native coronary artery without angina pectoris: Secondary | ICD-10-CM | POA: Insufficient documentation

## 2013-03-19 NOTE — Progress Notes (Signed)
Rod Hamblen 68 y.o. male Nutrition Note Spoke with pt and pt's wife. Nutrition Plan and Nutrition Survey goals reviewed with pt. Pt is following the diet described in Dr. Riley KillEsselstyn's book "How to Prevent and Reverse Heart Disease", which is a no added fat, vegan diet. Pt's current diet is more restrictive than the recommended Therapeutic Lifestyle Changes diet. Pt states he has lost wt from 185 to 169 lb and wants to gain wt. Pt's wt reviewed in EMR and pt wt appears to have decreased 5.1 lb over the past 2 months. Pt is at a normal wt for his height. Wt gain tips while following pt's diet reviewed. Per discussion, pt wants to maintain a wt of 170-175 lb "naked." Pt is taking a MVI daily. Pt expressed understanding of the information reviewed. Pt aware of nutrition education classes offered.   Nutrition Diagnosis   Food-and nutrition-related knowledge deficit related to lack of exposure to information as related to diagnosis of: ? CVD    Undesired wt loss related to decreased energy intake as evidenced by recent wt loss  Nutrition RX/ Re-Estimated Daily Nutrition Needs for: wt gain  3000-3500 Kcal, 80-95 gm fat, 20-23 gm sat fat, 3-3.5 gm trans-fat, <1500 mg sodium   Nutrition Intervention   Pt's individual nutrition plan including cholesterol goals reviewed with pt.   Benefits of adopting Therapeutic Lifestyle Changes discussed when Medficts reviewed.   Pt to attend the Portion Distortion class   Pt encouraged to add a vitamin B12 supplement daily and consult with his MD re: possible vitamin D supplementation.   Pt given handouts for: ? Nutrition I class ? Nutrition II class   Continue client-centered nutrition education by RD, as part of interdisciplinary care.  Goal(s)   Pt to identify food quantities necessary to achieve: ? wt gain to a goal wt of 175-180 lb (79.5-81.8 kg) at graduation from cardiac rehab.   Monitor and Evaluate progress toward nutrition goal with team. Nutrition Risk:   Low   Mickle PlumbEdna Mercedies Ganesh, M.Ed, RD, LDN, CDE 03/19/2013 3:09 PM

## 2013-03-19 NOTE — Progress Notes (Signed)
Increased PVC's noted this afternoon. Vital signs stable. Patient asymptomatic. Rod has a history of PVC's. Will fax exercise flow sheets to Dr. Alford HighlandMclean's office for review.

## 2013-03-21 ENCOUNTER — Encounter (HOSPITAL_COMMUNITY)
Admission: RE | Admit: 2013-03-21 | Discharge: 2013-03-21 | Disposition: A | Discharge status: Home / Self Care | Payer: Medicare PPO | Source: Ambulatory Visit | Attending: Cardiology | Admitting: Cardiology

## 2013-03-23 ENCOUNTER — Encounter (HOSPITAL_COMMUNITY)
Admission: RE | Admit: 2013-03-23 | Discharge: 2013-03-23 | Disposition: A | Discharge status: Home / Self Care | Payer: Medicare PPO | Source: Ambulatory Visit | Attending: Cardiology | Admitting: Cardiology

## 2013-03-26 ENCOUNTER — Encounter (HOSPITAL_COMMUNITY)
Admission: RE | Admit: 2013-03-26 | Discharge: 2013-03-26 | Disposition: A | Discharge status: Home / Self Care | Payer: Medicare PPO | Source: Ambulatory Visit | Attending: Cardiology | Admitting: Cardiology

## 2013-03-28 ENCOUNTER — Encounter (HOSPITAL_COMMUNITY)
Admission: RE | Admit: 2013-03-28 | Discharge: 2013-03-28 | Disposition: A | Discharge status: Home / Self Care | Payer: Medicare PPO | Source: Ambulatory Visit | Attending: Cardiology | Admitting: Cardiology

## 2013-03-30 ENCOUNTER — Encounter (HOSPITAL_COMMUNITY)
Admission: RE | Admit: 2013-03-30 | Discharge: 2013-03-30 | Disposition: A | Discharge status: Home / Self Care | Payer: Medicare PPO | Source: Ambulatory Visit | Attending: Cardiology | Admitting: Cardiology

## 2013-04-02 ENCOUNTER — Encounter (HOSPITAL_COMMUNITY)
Admission: RE | Admit: 2013-04-02 | Discharge: 2013-04-02 | Disposition: A | Discharge status: Home / Self Care | Payer: Medicare PPO | Source: Ambulatory Visit | Attending: Cardiology | Admitting: Cardiology

## 2013-04-03 ENCOUNTER — Other Ambulatory Visit (INDEPENDENT_AMBULATORY_CARE_PROVIDER_SITE_OTHER): Payer: Medicare PPO

## 2013-04-03 ENCOUNTER — Telehealth: Payer: Self-pay | Admitting: *Deleted

## 2013-04-03 DIAGNOSIS — I2510 Atherosclerotic heart disease of native coronary artery without angina pectoris: Secondary | ICD-10-CM

## 2013-04-03 LAB — HEPATIC FUNCTION PANEL
ALT: 25 U/L (ref 0–53)
AST: 35 U/L (ref 0–37)
Albumin: 4.2 g/dL (ref 3.5–5.2)
Alkaline Phosphatase: 43 U/L (ref 39–117)
BILIRUBIN TOTAL: 0.8 mg/dL (ref 0.3–1.2)
Bilirubin, Direct: 0 mg/dL (ref 0.0–0.3)
Total Protein: 6.7 g/dL (ref 6.0–8.3)

## 2013-04-03 LAB — LIPID PANEL
CHOLESTEROL: 93 mg/dL (ref 0–200)
HDL: 30.2 mg/dL — ABNORMAL LOW (ref >39.00)
LDL Cholesterol: 46 mg/dL (ref 0–99)
TRIGLYCERIDES: 83 mg/dL (ref 0.0–149.0)
Total CHOL/HDL Ratio: 3
VLDL: 16.6 mg/dL (ref 0.0–40.0)

## 2013-04-03 NOTE — Telephone Encounter (Signed)
pt's wife notified about lab results with verbal understanding. I did advised extra 10-15 minutes of walking a day can help boos the HDL, wife states doing more plant based diet. I said he may not be getting enough Omega 3 fatty acids in diet now.

## 2013-04-04 ENCOUNTER — Encounter (HOSPITAL_COMMUNITY)
Admission: RE | Admit: 2013-04-04 | Discharge: 2013-04-04 | Disposition: A | Discharge status: Home / Self Care | Payer: Medicare PPO | Source: Ambulatory Visit | Attending: Cardiology | Admitting: Cardiology

## 2013-04-05 ENCOUNTER — Encounter: Payer: Self-pay | Admitting: Cardiology

## 2013-04-05 ENCOUNTER — Ambulatory Visit (INDEPENDENT_AMBULATORY_CARE_PROVIDER_SITE_OTHER): Payer: Medicare PPO | Admitting: Cardiology

## 2013-04-05 VITALS — BP 130/70 | HR 60 | Ht 75.5 in | Wt 177.0 lb

## 2013-04-05 DIAGNOSIS — I2510 Atherosclerotic heart disease of native coronary artery without angina pectoris: Secondary | ICD-10-CM

## 2013-04-05 DIAGNOSIS — I4949 Other premature depolarization: Secondary | ICD-10-CM

## 2013-04-05 DIAGNOSIS — I493 Ventricular premature depolarization: Secondary | ICD-10-CM

## 2013-04-05 NOTE — Patient Instructions (Signed)
Your physician wants you to follow-up in: 6 months with Dr McLean. (September 2015) You will receive a reminder letter in the mail two months in advance. If you don't receive a letter, please call our office to schedule the follow-up appointment.  

## 2013-04-06 ENCOUNTER — Encounter (HOSPITAL_COMMUNITY)
Admission: RE | Admit: 2013-04-06 | Discharge: 2013-04-06 | Disposition: A | Discharge status: Home / Self Care | Payer: Medicare PPO | Source: Ambulatory Visit | Attending: Cardiology | Admitting: Cardiology

## 2013-04-07 NOTE — Progress Notes (Signed)
Patient ID: Billy Adams, male   DOB: [birth year], 68 y.o.   MRN: [id number] PCP: Dr. Duaine Dredge  68 yo with presents for followup of PVCs and CAD.  Since high school, he can remember feeling palpitations with stressful events (public speaking, band solo, etc).  This summer, patient began to notice irregularity in his pulse after exercise.  He was seen by Dr. Duaine Dredge and had a holter monitor in 7/14 that showed frequent PVCs (7% of total complexes).  He cut caffeine out almost entirely but continued to have PVCs with and after exercise.  Patient has good exercise tolerance.  He walks and works out at [employer] though not regularly.  No exertional dyspnea or chest pain.  No lightheadedness or syncope.  He would like to start exercising more but but has been concerned about the frequent PVCs.  I had him get an echocardiogram and an ETT.  The echo showed normal LV and RV size and function. The ETT showed good exercise tolerance, but he had frequent PVCs including bigeminy with exercise and in recovery.  He also was noted to have significant ST depression with exercise.  Therefore, I had him do a coronary CT angiogram.  This showed quite extensive plaque in his coronaries.  The proximal LAD was especially involved, and there was concern for moderate to severe proximal LAD stenosis.  Next, we did a left heart cath that showed 80% pLAD stenosis and 60% mLAD stenosis with heavy calcification.  FFR 0.8, Promus DES x 2 placed to proximal LAD.   Patient is now in cardiac rehab.  He says that he feels like his exercise tolerance is better post-PCI.  Prior to PCI, he says he was getting upper back pain with exercise.  He had attributed this to muscle pain, but it has now resolved.  He also has been having only a few PVCs at cardiac rehab.   Labs (11/14): K 4.7, creatinine 0.9 Labs (3/15): LDL 46, HDL 30, LFTs normal, K 4.8, creatinine 0.77  PMH: 1. 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   Echo (11/14) with EF 55-60%, no significant valvular abnormalities.  2. GERD 3. Hyperlipidemia 4. BPH 5. History of prostate nodule 6. Venous varicosities 7. Degenerative disc disease  8. Impaired fasting glucose 9. CAD:  ETT (11/14) with 10:05 exercise, 1.5 mm ST depression V4-V6 and in the inferior leads, frequent PVCs including bigeminy with exercise, PVCs in recovery.  Coronary CT angiography (11/14) with calcium score 1659 Agatston units (95th percentile for age/gender), moderate to severe proximal LAD stenosis.  LHC (1/15) with 80% pLAD, 60% mLAD with FFR 0.8.  Patient had Promus DES x 2 to pLAD.   SH: Married, retired Hospital doctor, nonsmoker, rare ETOH.   FH: Father with first MI at 39, also with rheumatic heart disease.  Grandmother with CVA, mother with CVA at age 27.   ROS: All systems reviewed and negative except as per HPI.   Current Outpatient Prescriptions  Medication Sig Dispense Refill  . ALPRAZolam (XANAX) 0.25 MG tablet Take 0.25 mg by mouth at bedtime as needed for sleep. Take 1/2 tablet at night as needed for sleep a few times a month      . ARTIFICIAL TEAR OP Apply 1 drop to eye daily as needed (for dry eyes).      Marland Kitchen aspirin 81 MG tablet Take 81 mg by mouth daily.      Marland Kitchen atorvastatin (LIPITOR) 20 MG tablet Take  1 tablet (20 mg total) by mouth daily.  90 tablet  3  . calcium citrate-vitamin D 500-400 MG-UNIT chewable tablet Chew 1 tablet by mouth daily.      . clopidogrel (PLAVIX) 75 MG tablet Take 1 tablet (75 mg total) by mouth daily with breakfast.  30 tablet  11  . Coenzyme Q10 (CO Q 10) 100 MG CAPS Take 1 capsule by mouth daily.      Marland Kitchen. GRAPE SEED CR PO Take 2 capsules by mouth daily. Muscadine grape seed (resveratol)      . loratadine (CLARITIN) 10 MG tablet Take 10 mg by mouth as needed for allergies.      . Melatonin 3 MG TABS Take 1 tablet by mouth at bedtime as needed (for sleep).       . Multiple Vitamins-Minerals (MULTIVITAMIN WITH  MINERALS) tablet Take 1 tablet by mouth daily.      . nitroGLYCERIN (NITROSTAT) 0.4 MG SL tablet Place 1 tablet (0.4 mg total) under the tongue every 5 (five) minutes as needed for chest pain.  25 tablet  4   No current facility-administered medications for this visit.    BP 130/70  Pulse 60  Ht 6' 3.5" (1.918 m)  Wt 80.287 kg (177 lb)  BMI 21.82 kg/m2 General: NAD Neck: No JVD, no thyromegaly or thyroid nodule.  Lungs: Clear to auscultation bilaterally with normal respiratory effort. CV: Nondisplaced PMI.  Heart regular S1/S2, soft S4, no murmur.  No peripheral edema.  No carotid bruit.  Normal pedal pulses.  Abdomen: Soft, nontender, no hepatosplenomegaly, no distention.  Skin: Intact without lesions or rashes.  Neurologic: Alert and oriented x 3.  Psych: Normal affect. Extremities: No clubbing or cyanosis.   Assessment/Plan: 1. PVCs: Patient initially had a pattern of heavy PVCs during exercise.  Since PCI, these seem to have mostly resolved.   2. CAD: Status post PCI with Promus DES x 2 to proximal LAD.  Since then, exercise-induced PVCs have decreased and exertional upper back pain has resolved.  - Continue ASA 81 and Plavix x at least 1 year, if tolerated without significant bleeding would consider long-term Plavix.  - Continue atorvastatin.  3. Pulmonary nodule: Lung fields on the recent cardiac CT showed a small (6 mm) lung nodule.  Patient never smoked.  He will need a noncontrast CT chest in 1 year to follow the nodule.  4. Hyperlipidemia: Good lipids in 3/15.   Marca AnconaDalton McLean 04/07/2013

## 2013-04-09 ENCOUNTER — Encounter (HOSPITAL_COMMUNITY)
Admission: RE | Admit: 2013-04-09 | Discharge: 2013-04-09 | Disposition: A | Discharge status: Home / Self Care | Payer: Medicare PPO | Source: Ambulatory Visit | Attending: Cardiology | Admitting: Cardiology

## 2013-04-09 ENCOUNTER — Other Ambulatory Visit: Payer: Medicare PPO

## 2013-04-11 ENCOUNTER — Encounter (HOSPITAL_COMMUNITY)
Admission: RE | Admit: 2013-04-11 | Discharge: 2013-04-11 | Disposition: A | Discharge status: Home / Self Care | Payer: Medicare PPO | Source: Ambulatory Visit | Attending: Cardiology | Admitting: Cardiology

## 2013-04-12 ENCOUNTER — Ambulatory Visit: Payer: Medicare PPO | Admitting: Cardiology

## 2013-04-13 ENCOUNTER — Encounter (HOSPITAL_COMMUNITY): Payer: Medicare PPO

## 2013-04-13 ENCOUNTER — Telehealth (HOSPITAL_COMMUNITY): Payer: Self-pay | Admitting: Family Medicine

## 2013-04-16 ENCOUNTER — Encounter (HOSPITAL_COMMUNITY)
Admission: RE | Admit: 2013-04-16 | Discharge: 2013-04-16 | Disposition: A | Discharge status: Home / Self Care | Payer: Medicare PPO | Source: Ambulatory Visit | Attending: Cardiology | Admitting: Cardiology

## 2013-04-17 ENCOUNTER — Telehealth: Payer: Self-pay | Admitting: Cardiology

## 2013-04-17 NOTE — Telephone Encounter (Signed)
Per cardiac rehab, patient is transitioning from phase II to maintenance.  Needs referral for this. [facility] to be signed and sent back.

## 2013-04-17 NOTE — Telephone Encounter (Signed)
New message     Have not received the cardiac rehab referral.  Pt cannot start rehab until they get it .  Fax to [phone]

## 2013-04-18 ENCOUNTER — Telehealth: Payer: Self-pay | Admitting: Cardiology

## 2013-04-18 ENCOUNTER — Encounter (HOSPITAL_COMMUNITY)
Admission: RE | Admit: 2013-04-18 | Discharge: 2013-04-18 | Disposition: A | Discharge status: Home / Self Care | Payer: Medicare PPO | Source: Ambulatory Visit | Attending: Cardiology | Admitting: Cardiology

## 2013-04-18 DIAGNOSIS — I714 Abdominal aortic aneurysm, without rupture, unspecified: Secondary | ICD-10-CM | POA: Insufficient documentation

## 2013-04-18 DIAGNOSIS — I2510 Atherosclerotic heart disease of native coronary artery without angina pectoris: Secondary | ICD-10-CM | POA: Insufficient documentation

## 2013-04-18 DIAGNOSIS — E785 Hyperlipidemia, unspecified: Secondary | ICD-10-CM | POA: Insufficient documentation

## 2013-04-18 DIAGNOSIS — E7800 Pure hypercholesterolemia, unspecified: Secondary | ICD-10-CM | POA: Insufficient documentation

## 2013-04-18 DIAGNOSIS — Z9861 Coronary angioplasty status: Secondary | ICD-10-CM | POA: Insufficient documentation

## 2013-04-18 DIAGNOSIS — I10 Essential (primary) hypertension: Secondary | ICD-10-CM | POA: Insufficient documentation

## 2013-04-18 DIAGNOSIS — I4949 Other premature depolarization: Secondary | ICD-10-CM | POA: Insufficient documentation

## 2013-04-18 DIAGNOSIS — I200 Unstable angina: Secondary | ICD-10-CM | POA: Insufficient documentation

## 2013-04-18 DIAGNOSIS — Z5189 Encounter for other specified aftercare: Secondary | ICD-10-CM | POA: Insufficient documentation

## 2013-04-18 NOTE — Progress Notes (Signed)
Patient was noted to have increased PVC's today during exercise at cardiac rehab. Billy Adams worked harder today than usual his met level on the nustep was 5.4. Billy Adams said he drank about 6 ounces of his wife's coffee at [employer] along with his decaf coffee.  Billy Adams also told me that he felt a little dizzy when he changed from sitting to standing position in education class prior to exercise. Billy Adams was asymptomatic with exercise. Cecilie Kicks FNP-C called and notified. Today's ECG tracing's and 1 hour telemetry disclosure faxed to Dr Claris Gladden office for review. Will continue to monitor the patient throughout  the program. Patient knows to avoid further consumption of caffeine.

## 2013-04-18 NOTE — Telephone Encounter (Signed)
Cardiac rehab called with pt having increased PVCs during exercise.  Pt asymptomatic.  Strips to be sent to Dr. Shirlee LatchMcLean.  He has done this on one other recent occ. In rehab.

## 2013-04-20 ENCOUNTER — Encounter (HOSPITAL_COMMUNITY)
Admission: RE | Admit: 2013-04-20 | Discharge: 2013-04-20 | Disposition: A | Discharge status: Home / Self Care | Payer: Medicare PPO | Source: Ambulatory Visit | Attending: Cardiology | Admitting: Cardiology

## 2013-04-23 ENCOUNTER — Encounter (HOSPITAL_COMMUNITY)
Admission: RE | Admit: 2013-04-23 | Discharge: 2013-04-23 | Disposition: A | Discharge status: Home / Self Care | Payer: Medicare PPO | Source: Ambulatory Visit | Attending: Cardiology | Admitting: Cardiology

## 2013-04-24 NOTE — Telephone Encounter (Signed)
Please send referral

## 2013-04-24 NOTE — Telephone Encounter (Signed)
Follow up     Cardiac rehab has not received the referral

## 2013-04-24 NOTE — Telephone Encounter (Signed)
Referral for Phase II faxed to Cardiac Rehab [phone]

## 2013-04-25 ENCOUNTER — Encounter (HOSPITAL_COMMUNITY)
Admission: RE | Admit: 2013-04-25 | Discharge: 2013-04-25 | Disposition: A | Discharge status: Home / Self Care | Payer: Medicare PPO | Source: Ambulatory Visit | Attending: Cardiology | Admitting: Cardiology

## 2013-04-27 ENCOUNTER — Encounter (HOSPITAL_COMMUNITY)
Admission: RE | Admit: 2013-04-27 | Discharge: 2013-04-27 | Disposition: A | Discharge status: Home / Self Care | Payer: Medicare PPO | Source: Ambulatory Visit | Attending: Cardiology | Admitting: Cardiology

## 2013-04-30 ENCOUNTER — Encounter (HOSPITAL_COMMUNITY)
Admission: RE | Admit: 2013-04-30 | Discharge: 2013-04-30 | Disposition: A | Discharge status: Home / Self Care | Payer: Medicare PPO | Source: Ambulatory Visit | Attending: Cardiology | Admitting: Cardiology

## 2013-05-01 ENCOUNTER — Telehealth: Payer: Self-pay | Admitting: Cardiology

## 2013-05-01 NOTE — Telephone Encounter (Signed)
I spoke with the pt and 2 weeks ago the pt asked to be switched from the Phase 2 (monitored) program to the Phase 4 (maintenance program).  It looks like we have previously faxed an order but it was for the Phase 2 program. The pt would like to make sure that Dr Shirlee LatchMcLean is okay with him switching to the maintenance program and if so he would like an order faxed to cardiac rehab.  I will forward this message to [facility] and Dr Shirlee LatchMcLean for review.

## 2013-05-01 NOTE — Telephone Encounter (Signed)
New message     [facility].  Pt want to go to the less intensive program.  Pt want to talk to the nurse because forms being faxed back to cardiac rehab does not indicate he can move the the other program.

## 2013-05-02 ENCOUNTER — Encounter (HOSPITAL_COMMUNITY)
Admission: RE | Admit: 2013-05-02 | Discharge: 2013-05-02 | Disposition: A | Discharge status: Home / Self Care | Payer: Medicare PPO | Source: Ambulatory Visit | Attending: Cardiology | Admitting: Cardiology

## 2013-05-02 NOTE — Telephone Encounter (Signed)
LMTCB

## 2013-05-02 NOTE — Telephone Encounter (Signed)
Spoke with Carlette at Cardiac Rehab. She is going to fax an order for Cardiac Rehab Maintenance Program to me for Dr Shirlee LatchMcLean to sign and I will fax back to Cardiac Rehab once it has been signed.    LMTCB for pt.

## 2013-05-02 NOTE — Telephone Encounter (Signed)
Pt advised Dr Shirlee LatchMcLean OK with maintenance program at Cardiac Rehab, I am waiting on form for this to be faxed for Dr Shirlee LatchMcLean to sign.

## 2013-05-02 NOTE — Telephone Encounter (Signed)
That would be fine 

## 2013-05-02 NOTE — Telephone Encounter (Signed)
Follow up    Pt calling back to speak to Lankford.   Please give him a call back. Call cell after 2:45pm

## 2013-05-04 ENCOUNTER — Encounter (HOSPITAL_COMMUNITY)
Admission: RE | Admit: 2013-05-04 | Discharge: 2013-05-04 | Disposition: A | Discharge status: Home / Self Care | Payer: Medicare PPO | Source: Ambulatory Visit | Attending: Cardiology | Admitting: Cardiology

## 2013-05-04 NOTE — Telephone Encounter (Signed)
Form signed by Dr Shirlee LatchMcLean, will fax to Cardiac Rehab.

## 2013-05-04 NOTE — Progress Notes (Signed)
Rod graduates today and plans to continue exercise in the maintenance program due to his insurance co pays.

## 2013-05-04 NOTE — Telephone Encounter (Signed)
Pt advised have received cardiac rehab maintenance form.

## 2013-05-07 ENCOUNTER — Encounter (HOSPITAL_COMMUNITY)
Admission: RE | Admit: 2013-05-07 | Discharge: 2013-05-07 | Disposition: A | Discharge status: Home / Self Care | Payer: Self-pay | Source: Ambulatory Visit | Attending: Cardiology | Admitting: Cardiology

## 2013-05-07 ENCOUNTER — Encounter (HOSPITAL_COMMUNITY): Payer: Medicare PPO

## 2013-05-09 ENCOUNTER — Encounter (HOSPITAL_COMMUNITY): Payer: Medicare PPO

## 2013-05-09 ENCOUNTER — Encounter (HOSPITAL_COMMUNITY): Payer: Self-pay

## 2013-05-11 ENCOUNTER — Encounter (HOSPITAL_COMMUNITY): Payer: Medicare PPO

## 2013-05-11 ENCOUNTER — Encounter (HOSPITAL_COMMUNITY)
Admission: RE | Admit: 2013-05-11 | Discharge: 2013-05-11 | Disposition: A | Discharge status: Home / Self Care | Payer: Medicare PPO | Source: Ambulatory Visit | Attending: Cardiology | Admitting: Cardiology

## 2013-05-11 ENCOUNTER — Encounter (HOSPITAL_COMMUNITY): Payer: Self-pay

## 2013-05-14 ENCOUNTER — Encounter (HOSPITAL_COMMUNITY): Payer: Medicare PPO

## 2013-05-14 ENCOUNTER — Encounter (HOSPITAL_COMMUNITY): Payer: Self-pay

## 2013-05-14 ENCOUNTER — Encounter (HOSPITAL_COMMUNITY)
Admission: RE | Admit: 2013-05-14 | Discharge: 2013-05-14 | Disposition: A | Discharge status: Home / Self Care | Payer: Medicare PPO | Source: Ambulatory Visit | Attending: Cardiology | Admitting: Cardiology

## 2013-05-16 ENCOUNTER — Encounter (HOSPITAL_COMMUNITY): Payer: Self-pay

## 2013-05-16 ENCOUNTER — Encounter (HOSPITAL_COMMUNITY): Payer: Medicare PPO

## 2013-05-16 ENCOUNTER — Encounter (HOSPITAL_COMMUNITY)
Admission: RE | Admit: 2013-05-16 | Discharge: 2013-05-16 | Disposition: A | Discharge status: Home / Self Care | Payer: Medicare PPO | Source: Ambulatory Visit | Attending: Cardiology | Admitting: Cardiology

## 2013-05-18 ENCOUNTER — Encounter (HOSPITAL_COMMUNITY): Payer: Self-pay

## 2013-05-18 ENCOUNTER — Encounter (HOSPITAL_COMMUNITY)
Admission: RE | Admit: 2013-05-18 | Discharge: 2013-05-18 | Disposition: A | Discharge status: Home / Self Care | Payer: Self-pay | Source: Ambulatory Visit | Attending: Cardiology | Admitting: Cardiology

## 2013-05-18 DIAGNOSIS — E7800 Pure hypercholesterolemia, unspecified: Secondary | ICD-10-CM | POA: Insufficient documentation

## 2013-05-18 DIAGNOSIS — I714 Abdominal aortic aneurysm, without rupture, unspecified: Secondary | ICD-10-CM | POA: Insufficient documentation

## 2013-05-18 DIAGNOSIS — Z9861 Coronary angioplasty status: Secondary | ICD-10-CM | POA: Insufficient documentation

## 2013-05-18 DIAGNOSIS — Z5189 Encounter for other specified aftercare: Secondary | ICD-10-CM | POA: Insufficient documentation

## 2013-05-18 DIAGNOSIS — I2510 Atherosclerotic heart disease of native coronary artery without angina pectoris: Secondary | ICD-10-CM | POA: Insufficient documentation

## 2013-05-18 DIAGNOSIS — I4949 Other premature depolarization: Secondary | ICD-10-CM | POA: Insufficient documentation

## 2013-05-18 DIAGNOSIS — E785 Hyperlipidemia, unspecified: Secondary | ICD-10-CM | POA: Insufficient documentation

## 2013-05-18 DIAGNOSIS — I200 Unstable angina: Secondary | ICD-10-CM | POA: Insufficient documentation

## 2013-05-18 DIAGNOSIS — I10 Essential (primary) hypertension: Secondary | ICD-10-CM | POA: Insufficient documentation

## 2013-05-21 ENCOUNTER — Encounter (HOSPITAL_COMMUNITY): Payer: Self-pay

## 2013-05-21 ENCOUNTER — Encounter (HOSPITAL_COMMUNITY)
Admission: RE | Admit: 2013-05-21 | Discharge: 2013-05-21 | Disposition: A | Discharge status: Home / Self Care | Payer: Self-pay | Source: Ambulatory Visit | Attending: Cardiology | Admitting: Cardiology

## 2013-05-23 ENCOUNTER — Encounter (HOSPITAL_COMMUNITY): Payer: Self-pay

## 2013-05-23 ENCOUNTER — Encounter (HOSPITAL_COMMUNITY)
Admission: RE | Admit: 2013-05-23 | Discharge: 2013-05-23 | Disposition: A | Discharge status: Home / Self Care | Payer: Self-pay | Source: Ambulatory Visit | Attending: Cardiology | Admitting: Cardiology

## 2013-05-25 ENCOUNTER — Encounter (HOSPITAL_COMMUNITY): Payer: Self-pay

## 2013-05-25 ENCOUNTER — Encounter (HOSPITAL_COMMUNITY)
Admission: RE | Admit: 2013-05-25 | Discharge: 2013-05-25 | Disposition: A | Discharge status: Home / Self Care | Payer: Self-pay | Source: Ambulatory Visit | Attending: Cardiology | Admitting: Cardiology

## 2013-05-28 ENCOUNTER — Encounter (HOSPITAL_COMMUNITY): Payer: Self-pay

## 2013-05-28 ENCOUNTER — Encounter (HOSPITAL_COMMUNITY)
Admission: RE | Admit: 2013-05-28 | Discharge: 2013-05-28 | Disposition: A | Discharge status: Home / Self Care | Payer: Self-pay | Source: Ambulatory Visit | Attending: Cardiology | Admitting: Cardiology

## 2013-05-30 ENCOUNTER — Encounter (HOSPITAL_COMMUNITY): Payer: Self-pay

## 2013-06-01 ENCOUNTER — Encounter (HOSPITAL_COMMUNITY): Payer: Self-pay

## 2013-06-04 ENCOUNTER — Encounter (HOSPITAL_COMMUNITY): Payer: Self-pay

## 2013-06-06 ENCOUNTER — Encounter (HOSPITAL_COMMUNITY): Payer: Self-pay

## 2013-06-06 ENCOUNTER — Other Ambulatory Visit: Payer: Self-pay | Admitting: Family Medicine

## 2013-06-06 ENCOUNTER — Ambulatory Visit
Admission: RE | Admit: 2013-06-06 | Discharge: 2013-06-06 | Disposition: A | Discharge status: Home / Self Care | Payer: Medicare PPO | Source: Ambulatory Visit | Attending: Family Medicine | Admitting: Family Medicine

## 2013-06-06 DIAGNOSIS — R059 Cough, unspecified: Secondary | ICD-10-CM

## 2013-06-06 DIAGNOSIS — R05 Cough: Secondary | ICD-10-CM

## 2013-06-06 DIAGNOSIS — R509 Fever, unspecified: Secondary | ICD-10-CM

## 2013-06-08 ENCOUNTER — Encounter (HOSPITAL_COMMUNITY): Payer: Self-pay

## 2013-06-13 ENCOUNTER — Encounter (HOSPITAL_COMMUNITY)
Admission: RE | Admit: 2013-06-13 | Discharge: 2013-06-13 | Disposition: A | Discharge status: Home / Self Care | Payer: Self-pay | Source: Ambulatory Visit | Attending: Cardiology | Admitting: Cardiology

## 2013-06-13 ENCOUNTER — Encounter (HOSPITAL_COMMUNITY): Payer: Self-pay

## 2013-06-15 ENCOUNTER — Encounter (HOSPITAL_COMMUNITY): Payer: Self-pay

## 2013-06-15 ENCOUNTER — Encounter (HOSPITAL_COMMUNITY)
Admission: RE | Admit: 2013-06-15 | Discharge: 2013-06-15 | Disposition: A | Discharge status: Home / Self Care | Payer: Self-pay | Source: Ambulatory Visit | Attending: Cardiology | Admitting: Cardiology

## 2013-06-18 ENCOUNTER — Encounter (HOSPITAL_COMMUNITY): Payer: Self-pay

## 2013-06-18 ENCOUNTER — Encounter (HOSPITAL_COMMUNITY)
Admission: RE | Admit: 2013-06-18 | Discharge: 2013-06-18 | Disposition: A | Discharge status: Home / Self Care | Payer: Medicare PPO | Source: Ambulatory Visit | Attending: Cardiology | Admitting: Cardiology

## 2013-06-18 DIAGNOSIS — Z5189 Encounter for other specified aftercare: Secondary | ICD-10-CM | POA: Insufficient documentation

## 2013-06-18 DIAGNOSIS — I2510 Atherosclerotic heart disease of native coronary artery without angina pectoris: Secondary | ICD-10-CM | POA: Insufficient documentation

## 2013-06-18 DIAGNOSIS — Z9861 Coronary angioplasty status: Secondary | ICD-10-CM | POA: Insufficient documentation

## 2013-06-18 DIAGNOSIS — E7800 Pure hypercholesterolemia, unspecified: Secondary | ICD-10-CM | POA: Insufficient documentation

## 2013-06-18 DIAGNOSIS — I4949 Other premature depolarization: Secondary | ICD-10-CM | POA: Insufficient documentation

## 2013-06-18 DIAGNOSIS — I714 Abdominal aortic aneurysm, without rupture, unspecified: Secondary | ICD-10-CM | POA: Insufficient documentation

## 2013-06-18 DIAGNOSIS — E785 Hyperlipidemia, unspecified: Secondary | ICD-10-CM | POA: Insufficient documentation

## 2013-06-18 DIAGNOSIS — I200 Unstable angina: Secondary | ICD-10-CM | POA: Insufficient documentation

## 2013-06-18 DIAGNOSIS — I10 Essential (primary) hypertension: Secondary | ICD-10-CM | POA: Insufficient documentation

## 2013-06-20 ENCOUNTER — Encounter (HOSPITAL_COMMUNITY): Payer: Self-pay

## 2013-06-20 ENCOUNTER — Encounter (HOSPITAL_COMMUNITY)
Admission: RE | Admit: 2013-06-20 | Discharge: 2013-06-20 | Disposition: A | Discharge status: Home / Self Care | Payer: Medicare PPO | Source: Ambulatory Visit | Attending: Cardiology | Admitting: Cardiology

## 2013-06-22 ENCOUNTER — Encounter (HOSPITAL_COMMUNITY): Payer: Self-pay

## 2013-06-22 ENCOUNTER — Encounter (HOSPITAL_COMMUNITY)
Admission: RE | Admit: 2013-06-22 | Discharge: 2013-06-22 | Disposition: A | Discharge status: Home / Self Care | Payer: Medicare PPO | Source: Ambulatory Visit | Attending: Cardiology | Admitting: Cardiology

## 2013-06-25 ENCOUNTER — Encounter (HOSPITAL_COMMUNITY): Payer: Self-pay

## 2013-06-25 ENCOUNTER — Encounter (HOSPITAL_COMMUNITY)
Admission: RE | Admit: 2013-06-25 | Discharge: 2013-06-25 | Disposition: A | Discharge status: Home / Self Care | Payer: Medicare PPO | Source: Ambulatory Visit | Attending: Cardiology | Admitting: Cardiology

## 2013-06-27 ENCOUNTER — Encounter (HOSPITAL_COMMUNITY): Payer: Self-pay

## 2013-06-27 ENCOUNTER — Encounter (HOSPITAL_COMMUNITY)
Admission: RE | Admit: 2013-06-27 | Discharge: 2013-06-27 | Disposition: A | Discharge status: Home / Self Care | Payer: Medicare PPO | Source: Ambulatory Visit | Attending: Cardiology | Admitting: Cardiology

## 2013-06-29 ENCOUNTER — Encounter (HOSPITAL_COMMUNITY): Payer: Self-pay

## 2013-06-29 ENCOUNTER — Encounter (HOSPITAL_COMMUNITY)
Admission: RE | Admit: 2013-06-29 | Discharge: 2013-06-29 | Disposition: A | Discharge status: Home / Self Care | Payer: Medicare PPO | Source: Ambulatory Visit | Attending: Cardiology | Admitting: Cardiology

## 2013-07-02 ENCOUNTER — Encounter (HOSPITAL_COMMUNITY)
Admission: RE | Admit: 2013-07-02 | Discharge: 2013-07-02 | Disposition: A | Discharge status: Home / Self Care | Payer: Medicare PPO | Source: Ambulatory Visit | Attending: Cardiology | Admitting: Cardiology

## 2013-07-02 ENCOUNTER — Encounter (HOSPITAL_COMMUNITY): Payer: Self-pay

## 2013-07-04 ENCOUNTER — Encounter (HOSPITAL_COMMUNITY)
Admission: RE | Admit: 2013-07-04 | Discharge: 2013-07-04 | Disposition: A | Discharge status: Home / Self Care | Payer: Medicare PPO | Source: Ambulatory Visit | Attending: Cardiology | Admitting: Cardiology

## 2013-07-04 ENCOUNTER — Encounter (HOSPITAL_COMMUNITY): Payer: Self-pay

## 2013-07-06 ENCOUNTER — Encounter (HOSPITAL_COMMUNITY)
Admission: RE | Admit: 2013-07-06 | Discharge: 2013-07-06 | Disposition: A | Discharge status: Home / Self Care | Payer: Medicare PPO | Source: Ambulatory Visit | Attending: Cardiology | Admitting: Cardiology

## 2013-07-06 ENCOUNTER — Encounter (HOSPITAL_COMMUNITY): Payer: Self-pay

## 2013-07-09 ENCOUNTER — Encounter (HOSPITAL_COMMUNITY)
Admission: RE | Admit: 2013-07-09 | Discharge: 2013-07-09 | Disposition: A | Discharge status: Home / Self Care | Payer: Medicare PPO | Source: Ambulatory Visit | Attending: Cardiology | Admitting: Cardiology

## 2013-07-09 ENCOUNTER — Encounter (HOSPITAL_COMMUNITY): Payer: Self-pay

## 2013-07-11 ENCOUNTER — Encounter (HOSPITAL_COMMUNITY)
Admission: RE | Admit: 2013-07-11 | Discharge: 2013-07-11 | Disposition: A | Discharge status: Home / Self Care | Payer: Medicare PPO | Source: Ambulatory Visit | Attending: Cardiology | Admitting: Cardiology

## 2013-07-11 ENCOUNTER — Encounter (HOSPITAL_COMMUNITY): Payer: Self-pay

## 2013-07-13 ENCOUNTER — Encounter (HOSPITAL_COMMUNITY): Payer: Self-pay

## 2013-07-13 ENCOUNTER — Encounter (HOSPITAL_COMMUNITY)
Admission: RE | Admit: 2013-07-13 | Discharge: 2013-07-13 | Disposition: A | Discharge status: Home / Self Care | Payer: Medicare PPO | Source: Ambulatory Visit | Attending: Cardiology | Admitting: Cardiology

## 2013-07-16 ENCOUNTER — Encounter (HOSPITAL_COMMUNITY)
Admission: RE | Admit: 2013-07-16 | Discharge: 2013-07-16 | Disposition: A | Discharge status: Home / Self Care | Payer: Medicare PPO | Source: Ambulatory Visit | Attending: Cardiology | Admitting: Cardiology

## 2013-07-16 ENCOUNTER — Encounter (HOSPITAL_COMMUNITY): Payer: Self-pay

## 2013-07-18 ENCOUNTER — Encounter (HOSPITAL_COMMUNITY): Payer: Self-pay

## 2013-07-18 ENCOUNTER — Encounter (HOSPITAL_COMMUNITY): Payer: Medicare PPO

## 2013-07-23 ENCOUNTER — Encounter (HOSPITAL_COMMUNITY): Payer: Medicare PPO

## 2013-07-23 ENCOUNTER — Encounter (HOSPITAL_COMMUNITY): Payer: Self-pay

## 2013-07-25 ENCOUNTER — Encounter (HOSPITAL_COMMUNITY): Payer: Medicare PPO

## 2013-07-25 ENCOUNTER — Other Ambulatory Visit: Payer: Self-pay | Admitting: Family Medicine

## 2013-07-25 ENCOUNTER — Encounter (HOSPITAL_COMMUNITY): Payer: Self-pay

## 2013-07-25 DIAGNOSIS — R911 Solitary pulmonary nodule: Secondary | ICD-10-CM

## 2013-07-27 ENCOUNTER — Encounter (HOSPITAL_COMMUNITY): Payer: Medicare PPO

## 2013-07-27 ENCOUNTER — Ambulatory Visit
Admission: RE | Admit: 2013-07-27 | Discharge: 2013-07-27 | Disposition: A | Discharge status: Home / Self Care | Payer: Medicare PPO | Source: Ambulatory Visit | Attending: Family Medicine | Admitting: Family Medicine

## 2013-07-27 ENCOUNTER — Encounter (HOSPITAL_COMMUNITY): Payer: Self-pay

## 2013-07-27 DIAGNOSIS — R911 Solitary pulmonary nodule: Secondary | ICD-10-CM

## 2013-07-30 ENCOUNTER — Encounter (HOSPITAL_COMMUNITY): Payer: Medicare PPO

## 2013-07-30 ENCOUNTER — Encounter (HOSPITAL_COMMUNITY): Payer: Self-pay

## 2013-08-01 ENCOUNTER — Encounter (HOSPITAL_COMMUNITY): Payer: Self-pay

## 2013-08-01 ENCOUNTER — Encounter (HOSPITAL_COMMUNITY): Payer: Medicare PPO

## 2013-08-03 ENCOUNTER — Encounter (HOSPITAL_COMMUNITY): Payer: Self-pay

## 2013-08-03 ENCOUNTER — Telehealth: Payer: Self-pay | Admitting: Cardiology

## 2013-08-03 ENCOUNTER — Encounter (HOSPITAL_COMMUNITY): Payer: Medicare PPO

## 2013-08-03 NOTE — Telephone Encounter (Signed)
New message      Pt take plavix and 81mg  of aspirin. He is having slight burning pain in his abdomen.  In the past he has had problems with aspirin.  Can he stop aspirin for a while to see if the burning pain goes away?  He will stay on his plavix.

## 2013-08-03 NOTE — Telephone Encounter (Signed)
Patient states his abdominal pain is "not bad at all, just barely there but constant". States he is not sure what is causing it and that "it could even be a muscle strain from doing sit-ups". Has had previous times where taking ASA upset his stomach though and he was wondering if he could HOLD the ASA to see if the pain subsided. Patient denies nausea, vomiting or obvious signs of bleeding in stool. Education provided regarding importance of staying on ASA and Plavix after a procedure, per guidelines. Patient states he will remain on ASA and Plavix but would like Dr. Shirlee LatchMcLean to review this message for further advisement. Routed to Dr. Shirlee LatchMcLean.

## 2013-08-06 ENCOUNTER — Encounter (HOSPITAL_COMMUNITY): Payer: Self-pay

## 2013-08-06 ENCOUNTER — Encounter (HOSPITAL_COMMUNITY): Payer: Medicare PPO

## 2013-08-06 NOTE — Telephone Encounter (Signed)
If pain is not severe, would try to stay on meds as ordered for now.

## 2013-08-06 NOTE — Telephone Encounter (Signed)
LMTCB

## 2013-08-07 NOTE — Telephone Encounter (Signed)
Pt states abdominal pain is gone. He is aware of Dr Alford HighlandMcLean's recommendations to stay on meds and is fine with that.

## 2013-08-08 ENCOUNTER — Encounter (HOSPITAL_COMMUNITY): Payer: Medicare PPO

## 2013-08-08 ENCOUNTER — Encounter (HOSPITAL_COMMUNITY): Payer: Self-pay

## 2013-08-10 ENCOUNTER — Encounter (HOSPITAL_COMMUNITY): Payer: Self-pay

## 2013-08-10 ENCOUNTER — Encounter (HOSPITAL_COMMUNITY): Payer: Medicare PPO

## 2013-08-13 ENCOUNTER — Encounter (HOSPITAL_COMMUNITY): Payer: Medicare PPO

## 2013-08-13 ENCOUNTER — Encounter (HOSPITAL_COMMUNITY): Payer: Self-pay

## 2013-08-14 ENCOUNTER — Telehealth: Payer: Self-pay

## 2013-08-14 NOTE — Telephone Encounter (Signed)
After Dr. Leone PayorGessner reviewed records sent over from Dr. Latanya MaudlinPeter Blomgren's office he determined that patient is not due for colon until Oct. 2016.  Dr. Geoffery LyonsBlomgren's office and the patient have been notified of this.  The records sent over included pt's last colon/path report done by Dr. Sharrell KuJeffrey Medoff 11/02/2004.  Dx: hamartomatous polyp per path report.  These records will be sent to be scanned into epic.  A recall notice has been put into the system for Oct. 2016.

## 2013-08-15 ENCOUNTER — Encounter (HOSPITAL_COMMUNITY): Payer: Medicare PPO

## 2013-08-15 ENCOUNTER — Encounter (HOSPITAL_COMMUNITY): Payer: Self-pay

## 2013-08-17 ENCOUNTER — Encounter (HOSPITAL_COMMUNITY): Payer: Self-pay

## 2013-08-17 ENCOUNTER — Encounter (HOSPITAL_COMMUNITY): Payer: Medicare PPO

## 2013-08-20 ENCOUNTER — Encounter (HOSPITAL_COMMUNITY): Payer: Self-pay

## 2013-08-20 ENCOUNTER — Encounter: Payer: Self-pay | Admitting: Gastroenterology

## 2013-08-20 ENCOUNTER — Encounter (HOSPITAL_COMMUNITY): Payer: Medicare PPO

## 2013-08-22 ENCOUNTER — Encounter (HOSPITAL_COMMUNITY): Payer: Self-pay

## 2013-08-22 ENCOUNTER — Encounter (HOSPITAL_COMMUNITY): Payer: Medicare PPO

## 2013-08-24 ENCOUNTER — Encounter (HOSPITAL_COMMUNITY): Payer: Self-pay

## 2013-08-24 ENCOUNTER — Encounter (HOSPITAL_COMMUNITY): Payer: Medicare PPO

## 2013-08-27 ENCOUNTER — Encounter (HOSPITAL_COMMUNITY): Payer: Self-pay

## 2013-08-27 ENCOUNTER — Encounter (HOSPITAL_COMMUNITY): Payer: Medicare PPO

## 2013-08-29 ENCOUNTER — Encounter (HOSPITAL_COMMUNITY): Payer: Self-pay

## 2013-08-29 ENCOUNTER — Encounter (HOSPITAL_COMMUNITY): Payer: Medicare PPO

## 2013-08-31 ENCOUNTER — Encounter (HOSPITAL_COMMUNITY): Payer: Medicare PPO

## 2013-08-31 ENCOUNTER — Encounter (HOSPITAL_COMMUNITY): Payer: Self-pay

## 2013-09-03 ENCOUNTER — Encounter (HOSPITAL_COMMUNITY): Payer: Self-pay

## 2013-09-03 ENCOUNTER — Encounter (HOSPITAL_COMMUNITY): Payer: Medicare PPO

## 2013-09-05 ENCOUNTER — Encounter (HOSPITAL_COMMUNITY): Payer: Medicare PPO

## 2013-09-05 ENCOUNTER — Encounter (HOSPITAL_COMMUNITY): Payer: Self-pay

## 2013-09-07 ENCOUNTER — Encounter (HOSPITAL_COMMUNITY): Payer: Medicare PPO

## 2013-09-07 ENCOUNTER — Encounter (HOSPITAL_COMMUNITY): Payer: Self-pay

## 2013-09-10 ENCOUNTER — Encounter (HOSPITAL_COMMUNITY): Payer: Self-pay

## 2013-09-10 ENCOUNTER — Encounter (HOSPITAL_COMMUNITY): Payer: Medicare PPO

## 2013-09-12 ENCOUNTER — Encounter (HOSPITAL_COMMUNITY): Payer: Self-pay

## 2013-09-12 ENCOUNTER — Encounter (HOSPITAL_COMMUNITY): Payer: Medicare PPO

## 2013-09-14 ENCOUNTER — Encounter (HOSPITAL_COMMUNITY): Payer: Medicare PPO

## 2013-09-14 ENCOUNTER — Encounter (HOSPITAL_COMMUNITY): Payer: Self-pay

## 2013-09-17 ENCOUNTER — Encounter (HOSPITAL_COMMUNITY): Payer: Self-pay

## 2013-09-17 ENCOUNTER — Encounter (HOSPITAL_COMMUNITY): Payer: Medicare PPO

## 2013-09-19 ENCOUNTER — Encounter (HOSPITAL_COMMUNITY): Payer: Medicare PPO

## 2013-09-19 ENCOUNTER — Encounter (HOSPITAL_COMMUNITY): Payer: Self-pay

## 2013-09-19 ENCOUNTER — Telehealth: Payer: Self-pay | Admitting: Cardiology

## 2013-09-19 NOTE — Telephone Encounter (Signed)
Pt has appt with Dr Shirlee Latch 10/02/13. Pt is going to discuss medication adjustments (plavix and cholesterol medication) at time of appt with Dr Shirlee Latch.

## 2013-09-19 NOTE — Telephone Encounter (Signed)
New problem   Pt want to know if he can reduce his Statin after his appt. Please advise.

## 2013-09-21 ENCOUNTER — Encounter (HOSPITAL_COMMUNITY): Payer: Medicare PPO

## 2013-09-21 ENCOUNTER — Encounter (HOSPITAL_COMMUNITY): Payer: Self-pay

## 2013-09-26 ENCOUNTER — Encounter (HOSPITAL_COMMUNITY): Payer: Medicare PPO

## 2013-09-26 ENCOUNTER — Encounter (HOSPITAL_COMMUNITY): Payer: Self-pay

## 2013-09-28 ENCOUNTER — Encounter (HOSPITAL_COMMUNITY): Payer: Medicare PPO

## 2013-09-28 ENCOUNTER — Encounter (HOSPITAL_COMMUNITY): Payer: Self-pay

## 2013-10-01 ENCOUNTER — Encounter (HOSPITAL_COMMUNITY): Payer: Self-pay

## 2013-10-01 ENCOUNTER — Encounter (HOSPITAL_COMMUNITY): Payer: Medicare PPO

## 2013-10-02 ENCOUNTER — Encounter: Payer: Self-pay | Admitting: Cardiology

## 2013-10-02 ENCOUNTER — Ambulatory Visit (INDEPENDENT_AMBULATORY_CARE_PROVIDER_SITE_OTHER): Payer: Medicare PPO | Admitting: Cardiology

## 2013-10-02 VITALS — BP 146/80 | HR 71 | Ht 74.5 in | Wt 172.0 lb

## 2013-10-02 DIAGNOSIS — I2510 Atherosclerotic heart disease of native coronary artery without angina pectoris: Secondary | ICD-10-CM

## 2013-10-02 DIAGNOSIS — Z955 Presence of coronary angioplasty implant and graft: Secondary | ICD-10-CM

## 2013-10-02 DIAGNOSIS — I4949 Other premature depolarization: Secondary | ICD-10-CM

## 2013-10-02 DIAGNOSIS — I493 Ventricular premature depolarization: Secondary | ICD-10-CM

## 2013-10-02 DIAGNOSIS — Z9861 Coronary angioplasty status: Secondary | ICD-10-CM

## 2013-10-02 NOTE — Patient Instructions (Addendum)
Stop Plavix at the end of January 2016.  Your physician wants you to follow-up in: 6 months with Dr Shirlee Latch. (March 2016). You will receive a reminder letter in the mail two months in advance. If you don't receive a letter, please call our office to schedule the follow-up appointment.

## 2013-10-03 ENCOUNTER — Encounter (HOSPITAL_COMMUNITY): Payer: Self-pay

## 2013-10-03 ENCOUNTER — Encounter (HOSPITAL_COMMUNITY): Payer: Medicare PPO

## 2013-10-04 NOTE — Progress Notes (Signed)
Patient ID: Billy Adams, male   DOB: [birth year], 68 y.o.   MRN: [id number] PCP: Dr. Duaine Dredge  68 yo with presents for followup of PVCs and CAD.  Since high school, he can remember feeling palpitations with stressful events (public speaking, band solo, etc).  This summer, patient began to notice irregularity in his pulse after exercise.  He was seen by Dr. Duaine Dredge and had a holter monitor in 7/14 that showed frequent PVCs (7% of total complexes).  He cut caffeine out almost entirely but continued to have PVCs with and after exercise.  Patient has good exercise tolerance.  He walks and works out at [employer] though not regularly.  No exertional dyspnea or chest pain.  No lightheadedness or syncope.  He would like to start exercising more but but has been concerned about the frequent PVCs.  I had him get an echocardiogram and an ETT.  The echo showed normal LV and RV size and function. The ETT showed good exercise tolerance, but he had frequent PVCs including bigeminy with exercise and in recovery.  He also was noted to have significant ST depression with exercise.  Therefore, I had him do a coronary CT angiogram.  This showed quite extensive plaque in his coronaries.  The proximal LAD was especially involved, and there was concern for moderate to severe proximal LAD stenosis.  Next, we did a left heart cath that showed 80% pLAD stenosis and 60% mLAD stenosis with heavy calcification.  FFR 0.8, Promus DES x 2 placed to proximal LAD.   Patient has now completed cardiac rehab.  He says that he feels like his exercise tolerance is better post-PCI.  Prior to PCI, he says he was getting upper back pain with exercise.  He had attributed this to muscle pain, but it has now resolved.  He feels like his PVCs have decreased.  He has been going to the gym 3-4 days a week and walks on other days.  No exertional chest pain or dyspnea. BP elevated today, but usually runs in the 120s/60s.   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  Echo (11/14) with EF 55-60%, no significant valvular abnormalities.  2. GERD 3. Hyperlipidemia 4. BPH 5. History of prostate nodule 6. Venous varicosities 7. Degenerative disc disease  8. Impaired fasting glucose 9. CAD:  ETT (11/14) with 10:05 exercise, 1.5 mm ST depression V4-V6 and in the inferior leads, frequent PVCs including bigeminy with exercise, PVCs in recovery.  Coronary CT angiography (11/14) with calcium score 1659 Agatston units (95th percentile for age/gender), moderate to severe proximal LAD stenosis.  LHC (1/15) with 80% pLAD, 60% mLAD with FFR 0.8.  Patient had Promus DES x 2 to pLAD.  10. Pulmonary nodule: Noncontrast CT (7/15) with 6 mm nodule, likely benign.  Followup in 18 months recommended.   SH: Married, retired Hospital doctor, nonsmoker, rare ETOH.   FH: Father with first MI at 10, also with rheumatic heart disease.  Grandmother with CVA, mother with CVA at age 70.   ROS: All systems reviewed and negative except as per HPI.   Current Outpatient Prescriptions  Medication Sig Dispense Refill  . ALPRAZolam (XANAX) 0.25 MG tablet Take 0.25 mg by mouth at bedtime as needed for sleep. Take 1/2  tablet at night as needed for sleep a few times a month      . ARTIFICIAL TEAR OP Apply 1 drop to eye daily as needed (for dry eyes).      Marland Kitchen aspirin 81 MG tablet Take 81 mg by mouth daily.      Marland Kitchen atorvastatin (LIPITOR) 20 MG tablet Take 1 tablet (20 mg total) by mouth daily.  90 tablet  3  . cholecalciferol (VITAMIN D) 1000 UNITS tablet Take 1,000 Units by mouth daily.      . clopidogrel (PLAVIX) 75 MG tablet Take 1 tablet (75 mg total) by mouth daily with breakfast.  30 tablet  11  . Coenzyme Q10 (CO Q 10) 100 MG CAPS Take 1 capsule by mouth  daily.      Marland Kitchen GRAPE SEED CR PO Take 2 capsules by mouth daily. Muscadine grape seed (resveratol)      . loratadine (CLARITIN) 10 MG tablet Take 10 mg by mouth as needed for allergies.      . Multiple Vitamins-Minerals (MULTIVITAMIN WITH MINERALS) tablet Take 1 tablet by mouth daily.      . nitroGLYCERIN (NITROSTAT) 0.4 MG SL tablet Place 1 tablet (0.4 mg total) under the tongue every 5 (five) minutes as needed for chest pain.  25 tablet  4   No current facility-administered medications for this visit.    BP 146/80  Pulse 71  Ht 6' 2.5" (1.892 m)  Wt 172 lb (78.019 kg)  BMI 21.80 kg/m2 General: NAD Neck: No JVD, no thyromegaly or thyroid nodule.  Lungs: Clear to auscultation bilaterally with normal respiratory effort. CV: Nondisplaced PMI.  Heart regular S1/S2, soft S4, no murmur.  No peripheral edema.  No carotid bruit.  Normal pedal pulses.  Abdomen: Soft, nontender, no hepatosplenomegaly, no distention.  Skin: Intact without lesions or rashes.  Neurologic: Alert and oriented x 3.  Psych: Normal affect. Extremities: No clubbing or cyanosis.   Assessment/Plan: 1. PVCs: Patient initially had a pattern of heavy PVCs during exercise.  Since PCI, these seem to have mostly resolved.   2. CAD: Status post PCI with Promus DES x 2 to proximal LAD.  Since then, exercise-induced PVCs have decreased and exertional upper back pain has resolved.  - Continue ASA 81 and Plavix x 1 year. He may stop Plavix at the end of 1/16. - Continue atorvastatin.  3. Pulmonary nodule: Lung fields on the recent cardiac CT showed a small (6 mm) lung nodule. Patient never smoked.  He will need a noncontrast CT chest in 18 mos to follow the nodule.  4. Hyperlipidemia: Good lipids in 7/15.   Marca Ancona 10/04/2013

## 2013-10-05 ENCOUNTER — Ambulatory Visit: Payer: Medicare PPO | Admitting: Cardiology

## 2013-10-05 ENCOUNTER — Encounter (HOSPITAL_COMMUNITY): Payer: Medicare PPO

## 2013-10-05 ENCOUNTER — Encounter (HOSPITAL_COMMUNITY): Payer: Self-pay

## 2013-10-08 ENCOUNTER — Encounter (HOSPITAL_COMMUNITY): Payer: Medicare PPO

## 2013-10-08 ENCOUNTER — Encounter (HOSPITAL_COMMUNITY): Payer: Self-pay

## 2013-10-10 ENCOUNTER — Encounter (HOSPITAL_COMMUNITY): Payer: Self-pay

## 2013-10-10 ENCOUNTER — Encounter (HOSPITAL_COMMUNITY): Payer: Medicare PPO

## 2013-10-12 ENCOUNTER — Encounter (HOSPITAL_COMMUNITY): Payer: Self-pay

## 2013-10-12 ENCOUNTER — Encounter (HOSPITAL_COMMUNITY): Payer: Medicare PPO

## 2013-10-15 ENCOUNTER — Encounter (HOSPITAL_COMMUNITY): Payer: Self-pay

## 2013-10-15 ENCOUNTER — Encounter (HOSPITAL_COMMUNITY): Payer: Medicare PPO

## 2013-10-17 ENCOUNTER — Encounter (HOSPITAL_COMMUNITY): Payer: Medicare PPO

## 2013-10-17 ENCOUNTER — Encounter (HOSPITAL_COMMUNITY): Payer: Self-pay

## 2013-10-19 ENCOUNTER — Encounter (HOSPITAL_COMMUNITY): Payer: Medicare PPO

## 2013-10-19 ENCOUNTER — Encounter (HOSPITAL_COMMUNITY): Payer: Self-pay

## 2013-10-22 ENCOUNTER — Encounter (HOSPITAL_COMMUNITY): Payer: Self-pay

## 2013-10-22 ENCOUNTER — Encounter (HOSPITAL_COMMUNITY): Payer: Medicare PPO

## 2013-10-24 ENCOUNTER — Encounter (HOSPITAL_COMMUNITY): Payer: Self-pay

## 2013-10-24 ENCOUNTER — Encounter (HOSPITAL_COMMUNITY): Payer: Medicare PPO

## 2013-10-26 ENCOUNTER — Encounter (HOSPITAL_COMMUNITY): Payer: Self-pay

## 2013-10-26 ENCOUNTER — Encounter (HOSPITAL_COMMUNITY): Payer: Medicare PPO

## 2013-10-29 ENCOUNTER — Encounter (HOSPITAL_COMMUNITY): Payer: Medicare PPO

## 2013-10-29 ENCOUNTER — Encounter (HOSPITAL_COMMUNITY): Payer: Self-pay

## 2013-10-31 ENCOUNTER — Encounter (HOSPITAL_COMMUNITY): Payer: Medicare PPO

## 2013-10-31 ENCOUNTER — Encounter (HOSPITAL_COMMUNITY): Payer: Self-pay

## 2013-11-02 ENCOUNTER — Encounter (HOSPITAL_COMMUNITY): Payer: Self-pay

## 2013-11-02 ENCOUNTER — Encounter (HOSPITAL_COMMUNITY): Payer: Medicare PPO

## 2013-11-05 ENCOUNTER — Encounter (HOSPITAL_COMMUNITY): Payer: Medicare PPO

## 2013-11-05 ENCOUNTER — Encounter (HOSPITAL_COMMUNITY): Payer: Self-pay

## 2013-11-07 ENCOUNTER — Encounter (HOSPITAL_COMMUNITY): Payer: Medicare PPO

## 2013-11-07 ENCOUNTER — Encounter (HOSPITAL_COMMUNITY): Payer: Self-pay

## 2013-11-09 ENCOUNTER — Encounter (HOSPITAL_COMMUNITY): Payer: Self-pay

## 2013-11-09 ENCOUNTER — Encounter (HOSPITAL_COMMUNITY): Payer: Medicare PPO

## 2013-11-12 ENCOUNTER — Encounter (HOSPITAL_COMMUNITY): Payer: Self-pay

## 2013-11-12 ENCOUNTER — Encounter (HOSPITAL_COMMUNITY): Payer: Medicare PPO

## 2013-11-14 ENCOUNTER — Encounter (HOSPITAL_COMMUNITY): Payer: Medicare PPO

## 2013-11-14 ENCOUNTER — Encounter (HOSPITAL_COMMUNITY): Payer: Self-pay

## 2013-11-16 ENCOUNTER — Encounter (HOSPITAL_COMMUNITY): Payer: Self-pay

## 2013-11-16 ENCOUNTER — Encounter (HOSPITAL_COMMUNITY): Payer: Medicare PPO

## 2013-12-27 ENCOUNTER — Encounter (HOSPITAL_COMMUNITY): Payer: Self-pay | Admitting: Cardiology

## 2014-04-08 ENCOUNTER — Telehealth: Payer: Self-pay | Admitting: Cardiology

## 2014-04-08 DIAGNOSIS — I2510 Atherosclerotic heart disease of native coronary artery without angina pectoris: Secondary | ICD-10-CM

## 2014-04-08 DIAGNOSIS — E785 Hyperlipidemia, unspecified: Secondary | ICD-10-CM

## 2014-04-08 NOTE — Telephone Encounter (Signed)
Ok, let's check his lipids.

## 2014-04-08 NOTE — Telephone Encounter (Signed)
Pt advised,verbalized understanding. 

## 2014-04-08 NOTE — Telephone Encounter (Signed)
Pt states for about 2 months he has been on atorvastatin 10mg  instead of atorvastatin 20mg  daily.   Pt states he was having problems with "brain fog" and thought processes.  Pt states since  he has decreased dose of atorvastatin he feels his thought processes are clearer.  Pt advised I will forward to Dr Shirlee LatchMcLean for review.

## 2014-04-08 NOTE — Telephone Encounter (Signed)
New Msg      Pt c/o medication issue:  1. Name of Medication: Atorvastatin  2. How are you currently taking this medication (dosage and times per day)? 20mg / once daily  3. Are you having a reaction (difficulty breathing--STAT)? Pt feels cholesterol was too low, having trouble thinking properly.   4. What is your medication issue? Pt has been having side effects from this medication, pt has been breaking the pill half.  Is this okay?    Please  Return pt call.

## 2014-04-26 ENCOUNTER — Other Ambulatory Visit (INDEPENDENT_AMBULATORY_CARE_PROVIDER_SITE_OTHER): Payer: Medicare PPO | Admitting: *Deleted

## 2014-04-26 DIAGNOSIS — E785 Hyperlipidemia, unspecified: Secondary | ICD-10-CM

## 2014-04-26 DIAGNOSIS — I2510 Atherosclerotic heart disease of native coronary artery without angina pectoris: Secondary | ICD-10-CM

## 2014-04-26 LAB — LIPID PANEL
Cholesterol: 104 mg/dL (ref 0–200)
HDL: 37.9 mg/dL — ABNORMAL LOW (ref >39.00)
LDL CALC: 56 mg/dL (ref 0–99)
NONHDL: 66.1
Total CHOL/HDL Ratio: 3
Triglycerides: 50 mg/dL (ref 0.0–149.0)
VLDL: 10 mg/dL (ref 0.0–40.0)

## 2014-04-26 LAB — HEPATIC FUNCTION PANEL
ALBUMIN: 4.1 g/dL (ref 3.5–5.2)
ALT: 21 U/L (ref 0–53)
AST: 35 U/L (ref 0–37)
Alkaline Phosphatase: 43 U/L (ref 39–117)
BILIRUBIN TOTAL: 0.6 mg/dL (ref 0.2–1.2)
Bilirubin, Direct: 0.2 mg/dL (ref 0.0–0.3)
Total Protein: 6.7 g/dL (ref 6.0–8.3)

## 2014-04-26 NOTE — Addendum Note (Signed)
Addended by: Tonita PhoenixBOWDEN, Ashden Sonnenberg K on: 04/26/2014 08:46 AM   Modules accepted: Orders

## 2014-05-07 ENCOUNTER — Telehealth: Payer: Self-pay | Admitting: Cardiology

## 2014-05-07 MED ORDER — ATORVASTATIN CALCIUM 10 MG PO TABS: 10.0000 mg | ORAL_TABLET | Freq: Every day | ORAL | Status: DC

## 2014-05-07 NOTE — Telephone Encounter (Signed)
Pt requesting referral to lipid clinic to discuss cholesterol results/ medication.  Pt will keep appt with Dr Shirlee LatchMcLean scheduled for July.  Per Dr Milda SmartMcLean--will make referral to lipid clinic.

## 2014-05-07 NOTE — Telephone Encounter (Signed)
New message         Pt would like for [facility] to give him a call to go over his cholesterol   please give pt a call

## 2014-05-07 NOTE — Telephone Encounter (Signed)
Spoke with patient about recent cholesterol results.  

## 2014-05-07 NOTE — Addendum Note (Signed)
Addended by: Jacqlyn KraussLANKFORD, ANNE M on: 05/07/2014 04:46 PM   Modules accepted: Orders

## 2014-05-07 NOTE — Telephone Encounter (Signed)
LMTCB

## 2014-05-08 ENCOUNTER — Ambulatory Visit: Payer: Medicare PPO | Admitting: Cardiology

## 2014-05-10 ENCOUNTER — Ambulatory Visit (INDEPENDENT_AMBULATORY_CARE_PROVIDER_SITE_OTHER): Payer: Medicare PPO | Admitting: Pharmacist

## 2014-05-10 DIAGNOSIS — E785 Hyperlipidemia, unspecified: Secondary | ICD-10-CM

## 2014-05-10 MED ORDER — ROSUVASTATIN CALCIUM 5 MG PO TABS: 5.0000 mg | ORAL_TABLET | Freq: Every day | ORAL | Status: DC

## 2014-05-10 NOTE — Patient Instructions (Signed)
Stop Lipitor.   Start Crestor 5mg  once daily.  If you have any problems, please call Kennon RoundsSally at [phone].   Recheck labs in July with Dr. Duaine DredgeBlomgren.

## 2014-05-14 NOTE — Progress Notes (Signed)
HPI  [name] is a 69 yo pt of Dr. Shirlee LatchMcLean who was referred to the [facility] for a history of memory issues with atorvastatin.  He has a history of CAD s/p PCI to the LAD and PVCs.  He has a strong family history of cardiac disease with his father dying at 4059 after his 3rd heart attack.  He was first placed on simvastatin 20mg  5-6 years ago and then decreased to 10mg  daily.  After his PCI in January 2015, this was changed to atrovastatin 40mg  daily.  His LDL at that time was 42.  He has noticed some memory issues and "foggyness" while being on atorvastatin and has decreased it gradually from 40mg  to 20mg  and now to 10mg  in March.    Pt follows a very strict diet.  He has read a book by Dr. Neomia DearEsselstyn that suggested a plant-based, oil free diet.  He has followed this for a year.  He is concerned this may have contributed to his memory issues so he read a book by Dr. Cleotis NipperPerlmutter that suggested a fat heavy diet with no carbs.  At this time, he has added eggs, salmon, avocado, almonds, walnuts and almond butter.    Pt has a VIP physical by Dr. Duaine DredgeBlomgren each year.  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    Labs 04/2014: TC 104, LDL 56, HDL 56, TG 50 (atorvastatin 10mg  daily) 07/2013: TC 115, LDL 60, HDL 37, TG 70, Lp(a)-6, ApoB- 60  Current Outpatient Prescriptions  Medication Sig Dispense Refill  . ALPRAZolam (XANAX) 0.25 MG tablet Take 0.25 mg by mouth at bedtime as needed for sleep. Take 1/2 tablet at night as needed for sleep a few times a month    . ARTIFICIAL TEAR OP Apply 1 drop to eye daily as needed (for dry eyes).    Marland Kitchen. aspirin 81 MG tablet Take 81 mg by mouth daily.    . cholecalciferol (VITAMIN D) 1000 UNITS tablet Take 1,000 Units by mouth daily.    . clopidogrel (PLAVIX) 75 MG tablet Take 1 tablet (75 mg total) by mouth daily with breakfast. 30 tablet 11  . Coenzyme Q10 (CO Q 10) 100 MG CAPS Take 1 capsule by mouth daily.    Marland Kitchen. GRAPE SEED CR  PO Take 2 capsules by mouth daily. Muscadine grape seed (resveratol)    . loratadine (CLARITIN) 10 MG tablet Take 10 mg by mouth as needed for allergies.    . Multiple Vitamins-Minerals (MULTIVITAMIN WITH MINERALS) tablet Take 1 tablet by mouth daily.    . nitroGLYCERIN (NITROSTAT) 0.4 MG SL tablet Place 1 tablet (0.4 mg total) under the tongue every 5 (five) minutes as needed for chest pain. 25 tablet 4  . rosuvastatin (CRESTOR) 5 MG tablet Take 1 tablet (5 mg total) by mouth daily. 30 tablet 0   No current facility-administered medications for this visit.   Allergies  Allergen Reactions  . Other     RYE> diarrhea    Assessment and Plan  1.  Hyperlipidemia-  Pt's LDL well controlled on atorvastatin. LDL at goal.  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  Concerned for memory issues related to statin.  Will change from atorvastatin to Crestor as there has been less incidence of memory issues with Crestor.  Pt is agreeable to this.  Will change to Crestor 5mg  daily, which may result in lower LDL than atorvastatin 10mg  daily. Will not go any higher  at this time because patient is concerned if his LDL is too low than it may cause more memory issues.

## 2014-06-10 ENCOUNTER — Other Ambulatory Visit: Payer: Self-pay | Admitting: Cardiology

## 2014-06-11 NOTE — Telephone Encounter (Signed)
Per note 4.22.16

## 2014-07-15 ENCOUNTER — Other Ambulatory Visit: Payer: Self-pay

## 2014-07-25 ENCOUNTER — Ambulatory Visit (INDEPENDENT_AMBULATORY_CARE_PROVIDER_SITE_OTHER): Payer: Medicare PPO | Admitting: Cardiology

## 2014-07-25 ENCOUNTER — Encounter: Payer: Self-pay | Admitting: Cardiology

## 2014-07-25 VITALS — BP 124/64 | HR 66 | Ht 74.5 in | Wt 171.0 lb

## 2014-07-25 DIAGNOSIS — Z955 Presence of coronary angioplasty implant and graft: Secondary | ICD-10-CM | POA: Diagnosis not present

## 2014-07-25 DIAGNOSIS — I493 Ventricular premature depolarization: Secondary | ICD-10-CM | POA: Diagnosis not present

## 2014-07-25 NOTE — Patient Instructions (Signed)
Medication Instructions:    Your physician recommends that you continue on your current medications as directed. Please refer to the Current Medication list given to you today.    Labwork:   Testing/Procedures:   Follow-Up:  Your physician wants you to follow-up in:  In 6 months  You will receive a reminder letter in the mail two months in advance. If you don't receive a letter, please call our office to schedule the follow-up appointment.    Any Other Special Instructions Will Be Listed Below (If Applicable).

## 2014-07-26 NOTE — Progress Notes (Signed)
Patient ID: Billy Adams, male   DOB: 02/09/1945, 69 y.o.   MRN: [id number] PCP: Dr. Duaine Adams  69 yo with presents for followup of PVCs and CAD.  Since high school, he can remember feeling palpitations with stressful events (public speaking, band solo, etc).  This summer, patient began to notice irregularity in his pulse after exercise.  He was seen by Dr. Duaine Adams and had a holter monitor in 7/14 that showed frequent PVCs (7% of total complexes).  He cut caffeine out almost entirely but continued to have PVCs with and after exercise.  Patient has good exercise tolerance.  He walks and works out at [facility] [employer] though not regularly.  No exertional dyspnea or chest pain.  No lightheadedness or syncope.  He would like to start exercising more but but has been concerned about the frequent PVCs.  I had him get an echocardiogram and an ETT.  The echo showed normal LV and RV size and function. The ETT showed good exercise tolerance, but he had frequent PVCs including bigeminy with exercise and in recovery.  He also was noted to have significant ST depression with exercise.  Therefore, I had him do a coronary CT angiogram.  This showed quite extensive plaque in his coronaries.  The proximal LAD was especially involved, and there was concern for moderate to severe proximal LAD stenosis.  Next, we did a left heart cath that showed 80% pLAD stenosis and 60% mLAD stenosis with heavy calcification.  FFR 0.8, Promus DES x 2 placed to proximal LAD in 1/15. He stopped Plavix in 1/16  Prior to PCI, he says he was getting upper back pain with exercise.  He had attributed this to muscle pain, but it has now resolved and may have been anginal.  He has only rare PVCs now.  He gardens 2+ hours/day.  He walks 15-20 minutes in the mornings.  No exertional chest pain or dyspnea. He felt like he was getting a "brain fog" on atorvastatin and was concerned about risk of dementia.  He is now taking a low dose of Crestor.    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  Echo (11/14) with EF 55-60%, no significant valvular abnormalities.  2. GERD 3. Hyperlipidemia 4. BPH 5. History of prostate nodule 6. Venous varicosities 7. Degenerative disc disease  8. Impaired fasting glucose 9. CAD:  ETT (11/14) with 10:05 exercise, 1.5 mm ST depression V4-V6 and in the inferior leads, frequent PVCs including bigeminy with exercise, PVCs in recovery.  Coronary CT angiography (11/14) with calcium score 1659 Agatston units (95th percentile for age/gender), moderate to severe proximal LAD stenosis.  LHC (1/15) with 80% pLAD, 60% mLAD with FFR 0.8.  Patient had Promus DES x 2 to pLAD.  10. Pulmonary nodule: Noncontrast CT (7/15) with 6 mm nodule, likely benign.  Followup in 18 months recommended.   SH: Married, retired Hospital doctor, nonsmoker, rare ETOH.   FH: Father with first MI at 61, also with rheumatic heart disease.  Grandmother with CVA, mother with CVA at age 57.   ROS: All systems reviewed and negative except as per HPI.   Current Outpatient Prescriptions  Medication Sig Dispense Refill  . ALPRAZolam (XANAX) 0.25 MG tablet Take  0.25 mg by mouth at bedtime as needed for sleep. Take 1/2 tablet at night as needed for sleep a few times a month    . ARTIFICIAL TEAR OP Apply 1 drop to eye daily as needed (for dry eyes).    Marland Kitchen. aspirin 81 MG tablet Take 81 mg by mouth daily.    . Cholecalciferol (VITAMIN D) 2000 UNITS CAPS Take 1 capsule by mouth daily.    . Coenzyme Q10 (CO Q 10) 100 MG CAPS Take 1 capsule by mouth daily.    . CRESTOR 5 MG tablet TAKE 1 TABLET ONCE DAILY. 30 tablet 6  . Fexofenadine HCl (ALLEGRA PO) Take 1 tablet by mouth as needed.    Marland Kitchen. GRAPE SEED CR PO Take 2  capsules by mouth daily. Muscadine grape seed (resveratol)    . loratadine (CLARITIN) 10 MG tablet Take 10 mg by mouth as needed for allergies.    . Multiple Vitamins-Minerals (MULTIVITAMIN WITH MINERALS) tablet Take 1 tablet by mouth daily.    . nitroGLYCERIN (NITROSTAT) 0.4 MG SL tablet Place 1 tablet (0.4 mg total) under the tongue every 5 (five) minutes as needed for chest pain. 25 tablet 4  . Omega-3 Fatty Acids (FISH OIL PO) Take 1 capsule by mouth daily.    . TURMERIC PO Take 1 capsule by mouth daily.     No current facility-administered medications for this visit.    BP 124/64 mmHg  Pulse 66  Ht 6' 2.5" (1.892 m)  Wt 171 lb (77.565 kg)  BMI 21.67 kg/m2  SpO2 97% General: NAD Neck: No JVD, no thyromegaly or thyroid nodule.  Lungs: Clear to auscultation bilaterally with normal respiratory effort. CV: Nondisplaced PMI.  Heart regular S1/S2, soft S4, no murmur.  No peripheral edema.  No carotid bruit.  Normal pedal pulses. Prominent varicose veins.  Abdomen: Soft, nontender, no hepatosplenomegaly, no distention.  Skin: Intact without lesions or rashes.  Neurologic: Alert and oriented x 3.  Psych: Normal affect. Extremities: No clubbing or cyanosis.   Assessment/Plan: 1. PVCs: Patient initially had a pattern of heavy PVCs during exercise.  Since PCI, these seem to have mostly resolved.   2. CAD: Status post PCI with Promus DES x 2 to proximal LAD.  Since then, exercise-induced PVCs have decreased and exertional upper back pain has resolved.  - Continue ASA 81 and Crestor.  3. Hyperlipidemia: Now on Crestor 5 mg daily.  Feels like his thinking is more clear on Crestor.  Lipids done recently by PCP, will ask for copy.   Marca AnconaDalton Hollynn Adams 07/26/2014

## 2014-10-21 ENCOUNTER — Encounter: Payer: Self-pay | Admitting: Internal Medicine

## 2014-11-14 ENCOUNTER — Ambulatory Visit (AMBULATORY_SURGERY_CENTER): Payer: Self-pay | Admitting: *Deleted

## 2014-11-14 VITALS — Ht 75.25 in | Wt 174.0 lb

## 2014-11-14 DIAGNOSIS — Z86010 Personal history of colonic polyps: Secondary | ICD-10-CM

## 2014-11-14 NOTE — Progress Notes (Signed)
No egg or soy allergy. No anesthesia problems.  No home O2.  No diet meds.  

## 2014-11-21 ENCOUNTER — Encounter: Payer: Self-pay | Admitting: Internal Medicine

## 2014-12-02 ENCOUNTER — Telehealth: Payer: Self-pay | Admitting: Internal Medicine

## 2014-12-02 NOTE — Telephone Encounter (Signed)
Returned phone call to patient to answer questions regarding dietary restrictions for 5 days prior to his procedure. He is vegan and wanted to run some foods by us for approval. All questions answered.

## 2014-12-05 ENCOUNTER — Ambulatory Visit (AMBULATORY_SURGERY_CENTER): Payer: Medicare PPO | Admitting: Internal Medicine

## 2014-12-05 ENCOUNTER — Encounter: Payer: Self-pay | Admitting: Internal Medicine

## 2014-12-05 VITALS — BP 121/64 | HR 58 | Temp 97.1°F | Resp 20 | Ht 75.25 in | Wt 174.0 lb

## 2014-12-05 DIAGNOSIS — Z1211 Encounter for screening for malignant neoplasm of colon: Secondary | ICD-10-CM

## 2014-12-05 MED ORDER — SODIUM CHLORIDE 0.9 % IV SOLN: 500.0000 mL | INTRAVENOUS | Status: DC

## 2014-12-05 NOTE — Progress Notes (Signed)
Patient awakening,vss,report to rn 

## 2014-12-05 NOTE — Patient Instructions (Addendum)
;    Your colonoscopy was normal.  I do not recommend routine repeat colonoscopy but you may consider non-invasive screening tests for colon cancer in the future - 10 years from now.  I appreciate the opportunity to care for you. Iva Booparl E. Gessner, MD, FACG  YOU HAD AN ENDOSCOPIC PROCEDURE TODAY AT THE [facility]:   Refer to the procedure report that was given to you for any specific questions about what was found during the examination.  If the procedure report does not answer your questions, please call your gastroenterologist to clarify.  If you requested that your care partner not be given the details of your procedure findings, then the procedure report has been included in a sealed envelope for you to review at your convenience later.  YOU SHOULD EXPECT: Some feelings of bloating in the abdomen. Passage of more gas than usual.  Walking can help get rid of the air that was put into your GI tract during the procedure and reduce the bloating. If you had a lower endoscopy (such as a colonoscopy or flexible sigmoidoscopy) you may notice spotting of blood in your stool or on the toilet paper. If you underwent a bowel prep for your procedure, you may not have a normal bowel movement for a few days.  Please Note:  You might notice some irritation and congestion in your nose or some drainage.  This is from the oxygen used during your procedure.  There is no need for concern and it should clear up in a day or so.  SYMPTOMS TO REPORT IMMEDIATELY:   Following lower endoscopy (colonoscopy or flexible sigmoidoscopy):  Excessive amounts of blood in the stool  Significant tenderness or worsening of abdominal pains  Swelling of the abdomen that is new, acute  Fever of 100F or higher  For urgent or emergent issues, a gastroenterologist can be reached at any hour by calling [phone].   DIET: Your first meal following the procedure should be a small meal and then it is ok to  progress to your normal diet. Heavy or fried foods are harder to digest and may make you feel nauseous or bloated.  Likewise, meals heavy in dairy and vegetables can increase bloating.  Drink plenty of fluids but you should avoid alcoholic beverages for 24 hours.  ACTIVITY:  You should plan to take it easy for the rest of today and you should NOT DRIVE or use heavy machinery until tomorrow (because of the sedation medicines used during the test).    FOLLOW UP: Our staff will call the number listed on your records the next business day following your procedure to check on you and address any questions or concerns that you may have regarding the information given to you following your procedure. If we do not reach you, we will leave a message.  However, if you are feeling well and you are not experiencing any problems, there is no need to return our call.  We will assume that you have returned to your regular daily activities without incident  SIGNATURES/CONFIDENTIALITY: You and/or your care partner have signed paperwork which will be entered into your electronic medical record.  These signatures attest to the fact that that the information above on your After Visit Summary has been reviewed and is understood.  Full responsibility of the confidentiality of this discharge information lies with you and/or your care-partner.  Continue your normal medications  Please read over handout about diveritulosis

## 2014-12-05 NOTE — Progress Notes (Signed)
While in the RR, pt does have occ PVC- wife voiced concern about this.  When Dr. Leone PayorGessner came in RR to speak about results, he spoke with wife about this.  He states that he feels like they are benign and nothing to worry about

## 2014-12-05 NOTE — Op Note (Signed)
[facility] [address] [place], [id number]   COLONOSCOPY PROCEDURE REPORT  PATIENT: Billy HaiSwonguer, Rod  MR#: [id number] BIRTHDATE: [birth year] , 69  yrs. old GENDER: male ENDOSCOPIST: Iva Booparl E Gessner, MD, [facility] PROCEDURE DATE:  12/05/2014 PROCEDURE:   Colonoscopy, screening First Screening Colonoscopy - Avg.  risk and is 50 yrs.  old or older - No.  Prior Negative Screening - Now for repeat screening. 10 or more years since last screening  History of Adenoma - Now for follow-up colonoscopy & has been > or = to 3 yrs.  N/A  Polyps removed today? No Recommend repeat exam, <10 yrs? No ASA CLASS:   Class III INDICATIONS:Screening for colonic neoplasia and Colorectal Neoplasm Risk Assessment for this procedure is average risk. MEDICATIONS: Propofol 300 mg IV and Monitored anesthesia care  DESCRIPTION OF PROCEDURE:   After the risks benefits and alternatives of the procedure were thoroughly explained, informed consent was obtained.  The digital rectal exam revealed no abnormalities of the rectum, revealed no prostatic nodules, and revealed the prostate was not enlarged.   The LB [id number] endoscope was introduced through the anus and advanced to the cecum, which was identified by both the appendix and ileocecal valve. No adverse events experienced.   The quality of the prep was (MiraLax was used) good.  The instrument was then slowly withdrawn as the colon was fully examined. Estimated blood loss is zero unless otherwise noted in this procedure report.      COLON FINDINGS: There was mild diverticulosis noted in the sigmoid colon.   The examination was otherwise normal.  Retroflexed views revealed no abnormalities. The time to cecum = 5.3 Withdrawal time = 9.9   The scope was withdrawn and the procedure completed. COMPLICATIONS: There were no immediate complications.  ENDOSCOPIC IMPRESSION: 1.   Mild diverticulosis was noted in the sigmoid colon 2.   The  examination was otherwise normal  RECOMMENDATIONS: Routine repeat colonoscopy screening not necessary.  See me/GI as needed.   eSigned:  Iva Booparl E Gessner, MD, [facility] 12/05/2014 3:40 PM   cc: Dr. Mosetta PuttPeter Blomgren and The Patient

## 2014-12-06 ENCOUNTER — Telehealth: Payer: Self-pay | Admitting: *Deleted

## 2014-12-06 NOTE — Telephone Encounter (Signed)
  Follow up Call-  Call back number 12/05/2014  Post procedure Call Back phone  # [phone]  Permission to leave phone message Yes     Patient questions:  Do you have a fever, pain , or abdominal swelling? No. Pain Score  0 *  Have you tolerated food without any problems? Yes.    Have you been able to return to your normal activities? Yes.    Do you have any questions about your discharge instructions: Diet   No. Medications  No. Follow up visit  No.  Do you have questions or concerns about your Care? No.  Actions: * If pain score is 4 or above: No action needed, pain <4.

## 2015-01-27 ENCOUNTER — Other Ambulatory Visit: Payer: Self-pay | Admitting: Cardiology

## 2015-02-10 ENCOUNTER — Other Ambulatory Visit: Payer: Self-pay | Admitting: Family Medicine

## 2015-02-10 DIAGNOSIS — R911 Solitary pulmonary nodule: Secondary | ICD-10-CM

## 2015-02-14 ENCOUNTER — Ambulatory Visit
Admission: RE | Admit: 2015-02-14 | Discharge: 2015-02-14 | Disposition: A | Discharge status: Home / Self Care | Payer: 59 | Source: Ambulatory Visit | Attending: Family Medicine | Admitting: Family Medicine

## 2015-02-14 DIAGNOSIS — R911 Solitary pulmonary nodule: Secondary | ICD-10-CM

## 2015-04-18 ENCOUNTER — Encounter: Payer: Self-pay | Admitting: Cardiology

## 2015-04-18 ENCOUNTER — Ambulatory Visit (INDEPENDENT_AMBULATORY_CARE_PROVIDER_SITE_OTHER): Payer: Medicare Other | Admitting: Cardiology

## 2015-04-18 VITALS — BP 130/84 | HR 61 | Ht 75.5 in | Wt 173.4 lb

## 2015-04-18 DIAGNOSIS — E785 Hyperlipidemia, unspecified: Secondary | ICD-10-CM

## 2015-04-18 DIAGNOSIS — I493 Ventricular premature depolarization: Secondary | ICD-10-CM | POA: Diagnosis not present

## 2015-04-18 DIAGNOSIS — Z955 Presence of coronary angioplasty implant and graft: Secondary | ICD-10-CM | POA: Diagnosis not present

## 2015-04-18 MED ORDER — NITROGLYCERIN 0.4 MG SL SUBL: 0.4000 mg | SUBLINGUAL_TABLET | SUBLINGUAL | Status: AC | PRN

## 2015-04-18 NOTE — Patient Instructions (Signed)
Medication Instructions:  Your physician recommends that you continue on your current medications as directed. Please refer to the Current Medication list given to you today.   Labwork: None   Testing/Procedures: None   Follow-Up: Your physician wants you to follow-up in: 1 year with Dr Shirlee LatchMcLean. (March 2018).  You will receive a reminder letter in the mail two months in advance. If you don't receive a letter, please call our office to schedule the follow-up appointment.        If you need a refill on your cardiac medications before your next appointment, please call your pharmacy.

## 2015-04-20 NOTE — Progress Notes (Signed)
Patient ID: Billy Adams, male   DOB: 02/11/1945, 70 y.o.   MRN: [id number] PCP: Dr. Duaine Dredge  70 yo with presents for followup of PVCs and CAD.  Since high school, he can remember feeling palpitations with stressful events (public speaking, band solo, etc).  This summer, patient began to notice irregularity in his pulse after exercise.  He was seen by Dr. Duaine Dredge and had a holter monitor in 7/14 that showed frequent PVCs (7% of total complexes).  He cut caffeine out almost entirely but continued to have PVCs with and after exercise.  Patient has good exercise tolerance.  He walks and works out at [employer] though not regularly.  No exertional dyspnea or chest pain.  No lightheadedness or syncope.  He would like to start exercising more but but has been concerned about the frequent PVCs.  I had him get an echocardiogram and an ETT.  The echo showed normal LV and RV size and function. The ETT showed good exercise tolerance, but he had frequent PVCs including bigeminy with exercise and in recovery.  He also was noted to have significant ST depression with exercise.  Therefore, I had him do a coronary CT angiogram.  This showed quite extensive plaque in his coronaries.  The proximal LAD was especially involved, and there was concern for moderate to severe proximal LAD stenosis.  Next, we did a left heart cath that showed 80% pLAD stenosis and 60% mLAD stenosis with heavy calcification.  FFR 0.8, Promus DES x 2 placed to proximal LAD in 1/15. He stopped Plavix in 1/16  Prior to PCI, he says he was getting upper back pain with exercise.  He had attributed this to muscle pain, but it has now resolved and may have been anginal.  He has only rare PVCs now.  No exertional chest pain or dyspnea. He is active and tries to eat well.  He felt like he was getting a "brain fog" on atorvastatin and was concerned about risk of dementia.  He is now taking a low dose of Crestor.    ECG: NSR, normal  Labs (11/14): K 4.7,  creatinine 0.9 Labs (3/15): LDL 46, HDL 30, LFTs normal, K 4.8, creatinine 0.77 Labs (7/15): K 4.7, creatinine 0.77, LDL 60 Labs (4/16): LDL 56, HDL 56 Labs (7/16): LDL-P 409, LDL 64, HDL 39, TGs 48, hs-CRP 0.3  PMH: 1. 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  Echo (11/14) with EF 55-60%, no significant valvular abnormalities.  2. GERD 3. Hyperlipidemia 4. BPH 5. History of prostate nodule 6. Venous varicosities 7. Degenerative disc disease  8. Impaired fasting glucose 9. CAD:  ETT (11/14) with 10:05 exercise, 1.5 mm ST depression V4-V6 and in the inferior leads, frequent PVCs including bigeminy with exercise, PVCs in recovery.  Coronary CT angiography (11/14) with calcium score 1659 Agatston units (95th percentile for age/gender), moderate to severe proximal LAD stenosis.  LHC (1/15) with 80% pLAD, 60% mLAD with FFR 0.8.  Patient had Promus DES x 2 to pLAD.  10. Pulmonary nodule: Noncontrast CT (7/15) with 6 mm nodule, likely benign.  Followup in 18 months recommended.   SH: Married, retired Hospital doctor, nonsmoker, rare ETOH.   FH: Father with first MI at 33, also with rheumatic heart disease.  Grandmother with CVA, mother with CVA at age 25.   ROS: All systems reviewed and negative except as per HPI.   Current Outpatient Prescriptions  Medication Sig Dispense Refill  .  ALPRAZolam (XANAX) 0.25 MG tablet Take 0.25 mg by mouth at bedtime as needed for sleep. Take 1/2 tablet at night as needed for sleep a few times a month    . aspirin 81 MG tablet Take 81 mg by mouth daily.    . Cholecalciferol (VITAMIN D) 2000 UNITS CAPS Take 1 capsule by mouth daily.    . Coenzyme Q10 (CO Q 10) 100 MG CAPS Take 1 capsule by mouth daily.    . CRESTOR 5 MG tablet TAKE 1 TABLET ONCE DAILY. 30 tablet 7  . GRAPE SEED CR PO Take 2 capsules by mouth daily. Muscadine grape seed (resveratol)    . loratadine (CLARITIN) 10 MG tablet Take 10 mg by mouth as  needed for allergies.    . Multiple Vitamins-Minerals (MULTIVITAMIN WITH MINERALS) tablet Take 1 tablet by mouth daily.    . nitroGLYCERIN (NITROSTAT) 0.4 MG SL tablet Place 1 tablet (0.4 mg total) under the tongue every 5 (five) minutes as needed for chest pain. 100 tablet 3  . Omega-3 Fatty Acids (FISH OIL PO) Take 1 capsule by mouth daily.    . TURMERIC PO Take 1 capsule by mouth daily.    . vitamin B-12 (CYANOCOBALAMIN) 1000 MCG tablet Take 1,000 mcg by mouth daily.     No current facility-administered medications for this visit.    BP 130/84 mmHg  Pulse 61  Ht 6' 3.5" (1.918 m)  Wt 173 lb 6.4 oz (78.654 kg)  BMI 21.38 kg/m2 General: NAD Neck: No JVD, no thyromegaly or thyroid nodule.  Lungs: Clear to auscultation bilaterally with normal respiratory effort. CV: Nondisplaced PMI.  Heart regular S1/S2, soft S4, no murmur.  No peripheral edema.  No carotid bruit.  Normal pedal pulses. Prominent varicose veins.  Abdomen: Soft, nontender, no hepatosplenomegaly, no distention.  Skin: Intact without lesions or rashes.  Neurologic: Alert and oriented x 3.  Psych: Normal affect. Extremities: No clubbing or cyanosis.   Assessment/Plan: 1. PVCs: Patient initially had a pattern of heavy PVCs during exercise.  Since PCI, these seem to have mostly resolved.   2. CAD: Status post PCI with Promus DES x 2 to proximal LAD.  Since then, exercise-induced PVCs have decreased and exertional upper back pain has resolved.  - Continue ASA 81 and Crestor.  3. Hyperlipidemia: Now on Crestor 5 mg daily. Great lipids in 7/16.  Will have lipids with PCP at next appointment, will ask for copy.    [name] 04/20/2015

## 2015-04-28 ENCOUNTER — Other Ambulatory Visit: Payer: Self-pay | Admitting: Family Medicine

## 2015-04-28 DIAGNOSIS — R918 Other nonspecific abnormal finding of lung field: Secondary | ICD-10-CM

## 2015-05-16 ENCOUNTER — Ambulatory Visit
Admission: RE | Admit: 2015-05-16 | Discharge: 2015-05-16 | Disposition: A | Discharge status: Home / Self Care | Payer: Medicare Other | Source: Ambulatory Visit | Attending: Family Medicine | Admitting: Family Medicine

## 2015-05-16 DIAGNOSIS — R918 Other nonspecific abnormal finding of lung field: Secondary | ICD-10-CM

## 2016-04-20 NOTE — Progress Notes (Signed)
Cardiology Office Note   Date:  04/21/2016   ID:  Billy Adams, DOB 27-Aug-1945, MRN [id number]  PCP:  Carolyne Fiscal, MD    No chief complaint on file. CAD   Wt Readings from Last 3 Encounters:  04/21/16 172 lb (78 kg)  04/18/15 173 lb 6.4 oz (78.7 kg)  12/05/14 174 lb (78.9 kg)       History of Present Illness: Billy Adams is a 71 y.o. male  of PVCs and CAD.  Since high school, he can remember feeling palpitations with stressful events (public speaking, band solo, etc). He began to notice irregularity in his pulse after exercise.  He was seen by Dr. Duaine Dredge and had a holter monitor in 7/14 that showed frequent PVCs (7% of total complexes).  He cut caffeine out almost entirely but continued to have PVCs with and after exercise.   He had 2 DES to the LAD in 2014.  Other mild disease noted.  Only sx he could think of were pain between the shoulder blades with exercise.  PVCs were the biggest indicator at that time.    PVCs decreased after PCI.  He has had diagnosis of aortic atherosclerosis and had a small AAA, diagnosed in 2012. No imaging since that time.   He walks for exercise and uses an elliptical. He gardens as well and has no problems.     Past Medical History:  Diagnosis Date  . AAA (abdominal aortic aneurysm) (HCC)    pt denies  . Allergy    seasonal  . Arthralgia of foot    bilateral  . BPH (benign prostatic hypertrophy) 1999  . CAD (coronary artery disease), residual post PCI with 40% LM, 30% LCX 12/25/2012  . Cataract    surgery  . Coronary artery disease    DES TO LAD X2  . Degenerative disk disease    of cervical spine w/occasional radicular symptoms since 1985  . Detached retina 2003  . Detached retina    When right cataract was repaired, so Dr. Luciana Axe has recommened delaying the left side repair longer so there will be less chance of detachedment of the retina again.  Marland Kitchen GERD (gastroesophageal reflux disease)    occasionally-relief  with tums or zantac  . Hypercholesterolemia   . Hyperlipidemia 1995  . Hypertension   . Impaired glucose tolerance 2011  . Lactose intolerance   . Left cataract   . Osteoarthritis of both feet   . Palpitations    Pt has noticed an increase of palps lately, especially after exercise  . Prostate nodule 2009   Seeing Dr. Laverle Patter. Had another biopsy last year wich was benign. PSAs have been stable.  Marland Kitchen PVC's (premature ventricular contractions)    Holter monitor 07/27/12 showed frequent PVCs, representing about 7% of his beats. Has improved since we reduced his caffeine.  . Reflux esophagitis 2004   Improved over last year  . S/P coronary artery stent placement -Prox LAD, DES 01/31/13  02/01/2013  . Sleeping difficulties 2002   2-3 nights a wk. Takes Xanax & Melatonin PRN.  Marland Kitchen Varicose vein     Past Surgical History:  Procedure Laterality Date  . CARDIAC CATHETERIZATION  01/31/2013   DR MCLEAN  . CATARACT EXTRACTION W/ INTRAOCULAR LENS IMPLANT Right 2001  . CORONARY ANGIOPLASTY  01/31/2013   DES TO LAD X2  DR MACELHANY  . LEFT HEART CATHETERIZATION WITH CORONARY ANGIOGRAM Bilateral 01/31/2013   Procedure: LEFT HEART CATHETERIZATION WITH CORONARY ANGIOGRAM;  Surgeon: Freida Busman  Alford Highland, MD;  Location: [facility] CATH LAB;  Service: Cardiovascular;  Laterality: Bilateral;  . LESION REMOVAL  11/25/2011   Procedure: MINOR EXICISION OF LESION;  Surgeon: Wyn Forster., MD;  Location: [facility];  Service: Orthopedics;  Laterality: Right;  REMOVE THORN RIGHT INDEX FINGER  . PERCUTANEOUS CORONARY STENT INTERVENTION (PCI-S) Left 01/31/2013   Procedure: PERCUTANEOUS CORONARY STENT INTERVENTION (PCI-S);  Surgeon: Laurey Morale, MD;  Location[facility] CATH LAB;  Service: Cardiovascular;  Laterality: Left;  des x2 prox lad, ffr  . RETINAL DETACHMENT SURGERY Right 2003   With Sling Procedure  . TONSILLECTOMY  1950     Current Outpatient Prescriptions  Medication Sig Dispense Refill  . aspirin  81 MG tablet Take 81 mg by mouth daily.    . cholecalciferol (VITAMIN D) 1000 units tablet Take 1,000 Units by mouth daily.    . Coenzyme Q10 (CO Q 10) 100 MG CAPS Take 1 capsule by mouth daily.    Marland Kitchen GRAPE SEED CR PO Take 2 capsules by mouth daily. Muscadine grape seed (resveratol)    . loratadine (CLARITIN) 10 MG tablet Take 10 mg by mouth as needed for allergies.    . Multiple Vitamins-Minerals (MULTIVITAMIN WITH MINERALS) tablet Take 1 tablet by mouth daily.    . nitroGLYCERIN (NITROSTAT) 0.4 MG SL tablet Place 1 tablet (0.4 mg total) under the tongue every 5 (five) minutes as needed for chest pain. 100 tablet 3  . Omega-3 Fatty Acids (FISH OIL PO) Take 1 capsule by mouth daily.    . rosuvastatin (CRESTOR) 5 MG tablet Take 5 mg by mouth daily.    . TURMERIC PO Take 1 capsule by mouth daily.    . vitamin B-12 (CYANOCOBALAMIN) 1000 MCG tablet Take 1,000 mcg by mouth daily.    Marland Kitchen ALPRAZolam (XANAX) 0.25 MG tablet Take 0.25 mg by mouth at bedtime as needed for sleep. Take 1/2 tablet at night as needed for sleep a few times a month     No current facility-administered medications for this visit.     Allergies:   Other    Social History:  The patient  reports that he has never smoked. He has never used smokeless tobacco. He reports that he drinks alcohol. He reports that he does not use drugs.   Family History:  The patient's family history includes Anxiety disorder in his sister; Atrial fibrillation (age of onset: 48) in his mother; Bipolar disorder in his sister; CVA in his mother; Coronary artery disease in his father; Heart attack in his father; Heart disease in his father; Seasonal affective disorder in his sister.    ROS:  Please see the history of present illness.   Otherwise, review of systems are positive for varicose veins.   All other systems are reviewed and negative.    PHYSICAL EXAM: VS:  BP 128/68   Pulse 60   Ht  (1.905 m)   Wt 172 lb (78 kg)   BMI 21.50 kg/m  , BMI  Body mass index is 21.5 kg/m. GEN: Well nourished, well developed, in no acute distress  HEENT: normal  Neck: no JVD, carotid bruits, or masses Cardiac: RRR; no murmurs, rubs, or gallops,no edema ; varicose veins in the right leg Respiratory:  clear to auscultation bilaterally, normal work of breathing GI: soft, nontender, nondistended, + BS MS: no deformity or atrophy  Skin: warm and dry, no rash Neuro:  Strength and sensation are intact Psych: euthymic mood, full affect  EKG:   The ekg ordered 3/17 demonstrates NSR, no ST changes.  No change on today's ECG.   Recent Labs: No results found for requested labs within last 8760 hours.   Lipid Panel    Component Value Date/Time   CHOL 104 04/26/2014 0850   TRIG 50.0 04/26/2014 0850   HDL 37.90 (L) 04/26/2014 0850   CHOLHDL 3 04/26/2014 0850   VLDL 10.0 04/26/2014 0850   LDLCALC 56 04/26/2014 0850     Other studies Reviewed: Additional studies/ records that were reviewed today with results demonstrating: cath results reviewed.   ASSESSMENT AND PLAN:  1. CAD: COntinue aggressive secondary prevention.  He is not having angina.  He wants to be referred to a dietitian. Continue regular exercise. I offered cardiac rehabilitation again to him that he is not interested due to the cost. He will try to get 150 minutes per week of aerobic exercise. 2. PVCs: No PVC sx recently.  He had frequent PVCs prior to his LAD stents being placed. 3. Hyperlipidemia: Controlled per his report.  He took atorvastatin after the stent.  Cholesterol dropped significantly so he decreased his dose.  He has some memory issues and then switched to Crestor.  He follows a plant-based diet. 4. Aortic atherosclerosis: Will check ultrasound to evaluate dilated abdominal aorta. It has been several years since it was checked.   Current medicines are reviewed at length with the patient today.  The patient concerns regarding his medicines were addressed.  The  following changes have been made:  No change  Labs/ tests ordered today include:  No orders of the defined types were placed in this encounter.   Recommend 150 minutes/week of aerobic exercise Low fat, low carb, high fiber diet recommended  Disposition:   FU in 1 year   Signed, Lance Muss, MD  04/21/2016 3:18 PM    [facility] [address] [place], [place], [place]  [address] Phone: [phone]; Fax: [phone]

## 2016-04-21 ENCOUNTER — Encounter: Payer: Self-pay | Admitting: Interventional Cardiology

## 2016-04-21 ENCOUNTER — Ambulatory Visit (INDEPENDENT_AMBULATORY_CARE_PROVIDER_SITE_OTHER): Payer: Medicare Other | Admitting: Interventional Cardiology

## 2016-04-21 VITALS — BP 128/68 | HR 60 | Ht 75.0 in | Wt 172.0 lb

## 2016-04-21 DIAGNOSIS — I714 Abdominal aortic aneurysm, without rupture, unspecified: Secondary | ICD-10-CM

## 2016-04-21 DIAGNOSIS — I10 Essential (primary) hypertension: Secondary | ICD-10-CM

## 2016-04-21 DIAGNOSIS — E782 Mixed hyperlipidemia: Secondary | ICD-10-CM

## 2016-04-21 DIAGNOSIS — I700 Atherosclerosis of aorta: Secondary | ICD-10-CM

## 2016-04-21 DIAGNOSIS — R7302 Impaired glucose tolerance (oral): Secondary | ICD-10-CM | POA: Diagnosis not present

## 2016-04-21 DIAGNOSIS — I493 Ventricular premature depolarization: Secondary | ICD-10-CM | POA: Diagnosis not present

## 2016-04-21 DIAGNOSIS — Z955 Presence of coronary angioplasty implant and graft: Secondary | ICD-10-CM | POA: Diagnosis not present

## 2016-04-21 NOTE — Patient Instructions (Signed)
Medication Instructions:  Your physician recommends that you continue on your current medications as directed. Please refer to the Current Medication list given to you today.   Labwork: None ordered.  Testing/Procedures: Your physician has requested that you have an abdominal aorta duplex. During this test, an ultrasound is used to evaluate the aorta. Allow 30 minutes for this exam. Do not eat after midnight the day before and avoid carbonated beverages   Follow-Up: You have been referred to a Nutritionist.    Your physician wants you to follow-up in: 1 year with Dr. Eldridge Dace.  You will receive a reminder letter in the mail two months in advance. If you don't receive a letter, please call our office to schedule the follow-up appointment.   Any Other Special Instructions Will Be Listed Below (If Applicable).     If you need a refill on your cardiac medications before your next appointment, please call your pharmacy.

## 2016-05-27 ENCOUNTER — Ambulatory Visit (HOSPITAL_COMMUNITY)
Admission: RE | Admit: 2016-05-27 | Discharge: 2016-05-27 | Disposition: A | Discharge status: Home / Self Care | Payer: Medicare Other | Source: Ambulatory Visit | Attending: Cardiovascular Disease | Admitting: Cardiovascular Disease

## 2016-05-27 DIAGNOSIS — I714 Abdominal aortic aneurysm, without rupture, unspecified: Secondary | ICD-10-CM

## 2016-05-27 DIAGNOSIS — I708 Atherosclerosis of other arteries: Secondary | ICD-10-CM | POA: Insufficient documentation

## 2016-05-27 DIAGNOSIS — I700 Atherosclerosis of aorta: Secondary | ICD-10-CM | POA: Diagnosis not present

## 2016-05-27 DIAGNOSIS — I77810 Thoracic aortic ectasia: Secondary | ICD-10-CM | POA: Diagnosis not present

## 2016-07-26 ENCOUNTER — Encounter: Payer: Self-pay | Admitting: Interventional Cardiology

## 2016-11-03 ENCOUNTER — Encounter: Payer: Self-pay | Admitting: Interventional Cardiology

## 2017-04-20 NOTE — Progress Notes (Signed)
Cardiology Office Note   Date:  04/21/2017   ID:  Billy Adams, DOB [birth year], MRN [id number]  PCP:  Mosetta Putt, MD    No chief complaint on file. CAD   Wt Readings from Last 3 Encounters:  04/21/17 171 lb 6.4 oz (77.7 kg)  04/21/16 172 lb (78 kg)  04/18/15 173 lb 6.4 oz (78.7 kg)       History of Present Illness: Billy Adams is a 72 y.o. male with a h/o PVCs and CAD. Since high school, he can remember feeling palpitations with stressful events (public speaking, band solo, etc). He began to notice irregularity in his pulse after exercise. He was seen by Dr. Duaine Dredge and had a holter monitor in 7/14 that showed frequent PVCs (7% of total complexes). He cut caffeine out almost entirely but continued to have PVCs with and after exercise, seeral years ago.  This improved a few years later- see below.   He had 2 DES to the LAD in 2014.  Other mild disease noted.  Only sx he could think of were pain between the shoulder blades with exercise.  PVCs were the biggest indicator at that time.    PVCs decreased after PCI.  He has had diagnosis of aortic atherosclerosis and had a small AAA, diagnosed in 2012. 5/18 ultrasound showed: Mildly dilated aorta (up to 2.8 cm), no intervention required. 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    He has done well since the last visit.  He had some left sided abdominal pain after eating some spicy exotic foods.  He held his aspirin and stopped coffee for a few days.  He then restarted the aspirin every other day and the pain resolved.  He is back to daily aspirin.    Denies : Chest pain. Dizziness. Leg edema. Nitroglycerin use. Orthopnea. Palpitations. Paroxysmal nocturnal dyspnea. Shortness of breath. Syncope.   He exercises at the [facility] several times a week doing interval training on a machine like an elliptical. He also gardens a fair bit. No PVCs noticed for a while.    BP at home has been in the 120s systolic.  He  continues with his plant based diet.      Past Medical History:  Diagnosis Date  . AAA (abdominal aortic aneurysm) (HCC)    pt denies  . Allergy    seasonal  . Arthralgia of foot    bilateral  . BPH (benign prostatic hypertrophy) 1999  . CAD (coronary artery disease), residual post PCI with 40% LM, 30% LCX 12/25/2012  . Cataract    surgery  . Coronary artery disease    DES TO LAD X2  . Degenerative disk disease    of cervical spine w/occasional radicular symptoms since 1985  . Detached retina 2003  . Detached retina    When right cataract was repaired, so Dr. Luciana Axe has recommened delaying the left side repair longer so there will be less chance of detachedment of the retina again.  Marland Kitchen GERD (gastroesophageal reflux disease)    occasionally-relief with tums or zantac  . Hypercholesterolemia   . Hyperlipidemia 1995  . Hypertension   . Impaired glucose tolerance 2011  . Lactose intolerance   . Left cataract   . Osteoarthritis of both feet   . Palpitations    Pt has noticed an increase of palps lately, especially after exercise  . Prostate nodule 2009   Seeing Dr. Laverle Patter. Had another biopsy last year wich was benign. PSAs have been stable.  Marland Kitchen  PVC's (premature ventricular contractions)    Holter monitor 07/27/12 showed frequent PVCs, representing about 7% of his beats. Has improved since we reduced his caffeine.  . Reflux esophagitis 2004   Improved over last year  . S/P coronary artery stent placement -Prox LAD, DES 01/31/13  02/01/2013  . Sleeping difficulties 2002   2-3 nights a wk. Takes Xanax & Melatonin PRN.  Marland Kitchen Varicose vein     Past Surgical History:  Procedure Laterality Date  . CARDIAC CATHETERIZATION  01/31/2013   DR MCLEAN  . CATARACT EXTRACTION W/ INTRAOCULAR LENS IMPLANT Right 2001  . CORONARY ANGIOPLASTY  01/31/2013   DES TO LAD X2  DR MACELHANY  . LEFT HEART CATHETERIZATION WITH CORONARY ANGIOGRAM Bilateral 01/31/2013   Procedure: LEFT HEART CATHETERIZATION  WITH CORONARY ANGIOGRAM;  Surgeon: Laurey Morale, MD;  Location: [facility] CATH LAB;  Service: Cardiovascular;  Laterality: Bilateral;  . LESION REMOVAL  11/25/2011   Procedure: MINOR EXICISION OF LESION;  Surgeon: Wyn Forster., MD;  Location: [facility];  Service: Orthopedics;  Laterality: Right;  REMOVE THORN RIGHT INDEX FINGER  . PERCUTANEOUS CORONARY STENT INTERVENTION (PCI-S) Left 01/31/2013   Procedure: PERCUTANEOUS CORONARY STENT INTERVENTION (PCI-S);  Surgeon: Laurey Morale, MD;  Location: [facility] CATH LAB;  Service: Cardiovascular;  Laterality: Left;  des x2 prox lad, ffr  . RETINAL DETACHMENT SURGERY Right 2003   With Sling Procedure  . TONSILLECTOMY  1950     Current Outpatient Medications  Medication Sig Dispense Refill  . aspirin 81 MG tablet Take 81 mg by mouth daily.    . cholecalciferol (VITAMIN D) 1000 units tablet Take 1,000 Units by mouth daily.    . Coenzyme Q10 (CO Q 10) 100 MG CAPS Take 2 capsules by mouth daily.     Marland Kitchen GRAPE SEED CR PO Take 2 capsules by mouth daily. Muscadine grape seed (resveratol)    . loratadine (CLARITIN) 10 MG tablet Take 10 mg by mouth as needed for allergies.    Marland Kitchen MELATONIN PO Take 1 tablet by mouth as needed (for sleep).    . Multiple Vitamins-Minerals (MULTIVITAMIN WITH MINERALS) tablet Take 1 tablet by mouth daily.    . nitroGLYCERIN (NITROSTAT) 0.4 MG SL tablet Place 1 tablet (0.4 mg total) under the tongue every 5 (five) minutes as needed for chest pain. 100 tablet 3  . Omega-3 Fatty Acids (FISH OIL PO) Take 1 capsule by mouth daily.    . rosuvastatin (CRESTOR) 5 MG tablet Take 5 mg by mouth daily.    . TURMERIC PO Take 1 capsule by mouth daily.    . vitamin B-12 (CYANOCOBALAMIN) 1000 MCG tablet Take 1,000 mcg by mouth daily.     No current facility-administered medications for this visit.     Allergies:   Other    Social History:  The patient  reports that he has never smoked. He has never used smokeless tobacco. He  reports that he drinks alcohol. He reports that he does not use drugs.   Family History:  The patient's family history includes Anxiety disorder in his sister; Atrial fibrillation (age of onset: 47) in his mother; Bipolar disorder in his sister; CVA in his mother; Coronary artery disease in his father; Heart attack in his father; Heart disease in his father; Seasonal affective disorder in his sister.    ROS:  Please see the history of present illness.   Otherwise, review of systems are positive for abdominal pain.   All other systems  are reviewed and negative.    PHYSICAL EXAM: VS:  BP 140/70   Pulse (!) 56   Ht 6\' 3"  (1.905 m)   Wt 171 lb 6.4 oz (77.7 kg)   SpO2 99%   BMI 21.42 kg/m  , BMI Body mass index is 21.42 kg/m. GEN: Well nourished, well developed, in no acute distress  HEENT: normal  Neck: no JVD, carotid bruits, or masses Cardiac: RRR; no murmurs, rubs, or gallops,no edema , 2+ left PT pulse on exam today. Respiratory:  clear to auscultation bilaterally, normal work of breathing GI: soft, nontender, nondistended, + BS MS: no deformity or atrophy  Skin: warm and dry, no rash Neuro:  Strength and sensation are intact Psych: euthymic mood, full affect   EKG:   The ekg ordered today demonstrates NSR, no ST changes   Recent Labs: No results found for requested labs within last 8760 hours.   Lipid Panel    Component Value Date/Time   CHOL 104 04/26/2014 0850   TRIG 50.0 04/26/2014 0850   HDL 37.90 (L) 04/26/2014 0850   CHOLHDL 3 04/26/2014 0850   VLDL 10.0 04/26/2014 0850   LDLCALC 56 04/26/2014 0850     Other studies Reviewed: Additional studies/ records that were reviewed today with results demonstrating: Prior ultrasound reviewed.   ASSESSMENT AND PLAN:  1. CAD: No angina on medical therapy.  He will let us know if symptoms change.  He has been very aggressive in trying to eat healthy and stay active to maintain heart health. 2. PVCs: Asymptomatic.    3. Hyperlipidemia: Continue Crestor.  Checked with PMD.  He will get records from his PMD sent to us. 4. Aortic atherosclerosis:  5/18 ultrasound showed: Mildly dilated aorta (up to 2.8 cm), no intervention required. 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  2+ left PT pulse on exam today. 5. HTN: COntrolled at home.     Current medicines are reviewed at length with the patient today.  The patient concerns regarding his medicines were addressed.  The following changes have been made:  No change  Labs/ tests ordered today include:  No orders of the defined types were placed in this encounter.   Recommend 150 minutes/week of aerobic exercise Low fat, low carb, high fiber diet recommended  Disposition:   FU in 1 year   Signed, Lance MussJayadeep Merilee Wible, MD  04/21/2017 10:34 AM    [facility] [address], [place], [place]  [address] Phone: [phone]; Fax: [phone]) [phone]

## 2017-04-21 ENCOUNTER — Encounter: Payer: Self-pay | Admitting: Interventional Cardiology

## 2017-04-21 ENCOUNTER — Ambulatory Visit: Payer: Medicare Other | Admitting: Interventional Cardiology

## 2017-04-21 VITALS — BP 140/70 | HR 56 | Ht 75.0 in | Wt 171.4 lb

## 2017-04-21 DIAGNOSIS — I10 Essential (primary) hypertension: Secondary | ICD-10-CM | POA: Diagnosis not present

## 2017-04-21 DIAGNOSIS — I25119 Atherosclerotic heart disease of native coronary artery with unspecified angina pectoris: Secondary | ICD-10-CM | POA: Diagnosis not present

## 2017-04-21 DIAGNOSIS — E782 Mixed hyperlipidemia: Secondary | ICD-10-CM

## 2017-04-21 DIAGNOSIS — I700 Atherosclerosis of aorta: Secondary | ICD-10-CM

## 2017-04-21 DIAGNOSIS — I493 Ventricular premature depolarization: Secondary | ICD-10-CM | POA: Diagnosis not present

## 2017-04-21 NOTE — Patient Instructions (Signed)

## 2017-06-12 IMAGING — CT CT CHEST W/O CM
3 of 4 series · 17 of 30 positions shown, 19 images · non-contrast
Comparison: CT scan of July 27, 2013.

CLINICAL DATA: Right pulmonary nodule.

EXAM:
CT CHEST WITHOUT CONTRAST
TECHNIQUE: Multidetector CT imaging of the chest was performed following the
standard protocol without IV contrast.

[Series 3: chest w/o · axial · non-contrast · 0.72mm/px · z∈[-285,-40]mm · 5 of 75 slices shown, 7 images]
[im 13/75  mediastinal]
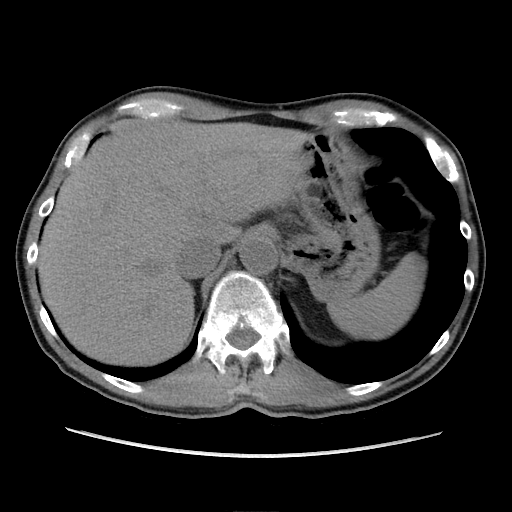
[im 13/75  lung]
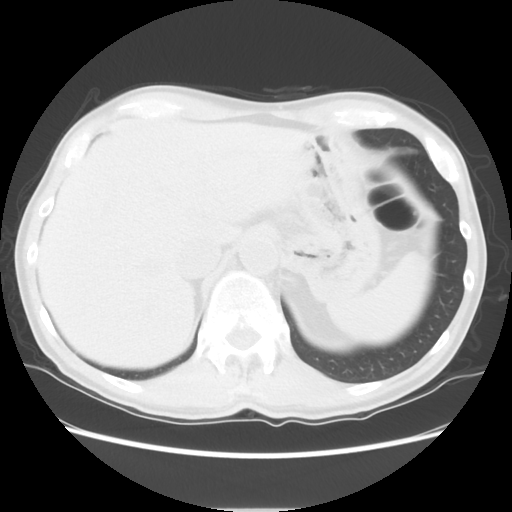
[im 25/75  lung]
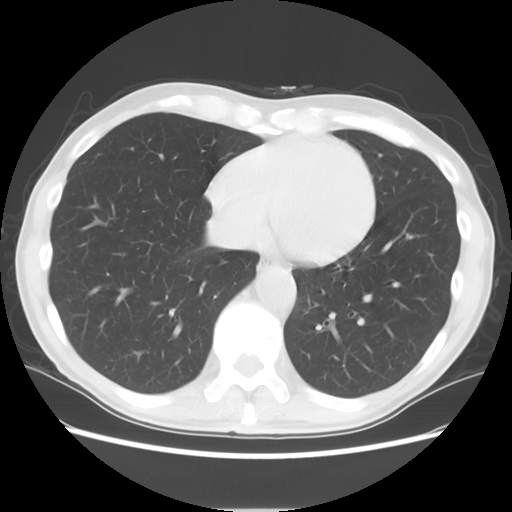
[im 38/75  lung]
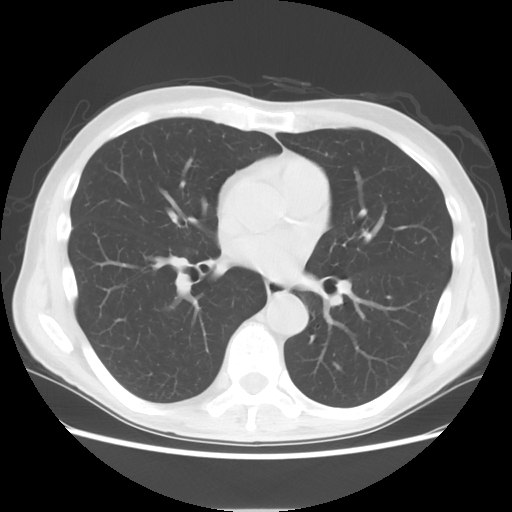
[im 50/75  lung]
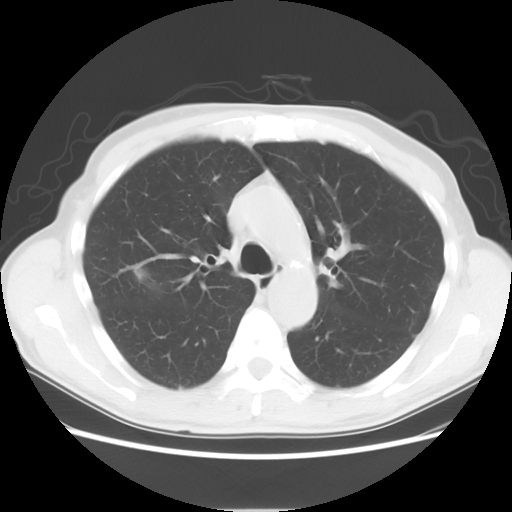
[im 62/75  mediastinal]
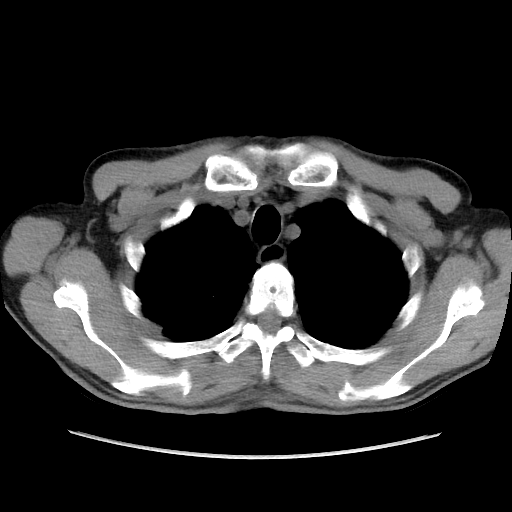
[im 62/75  lung]
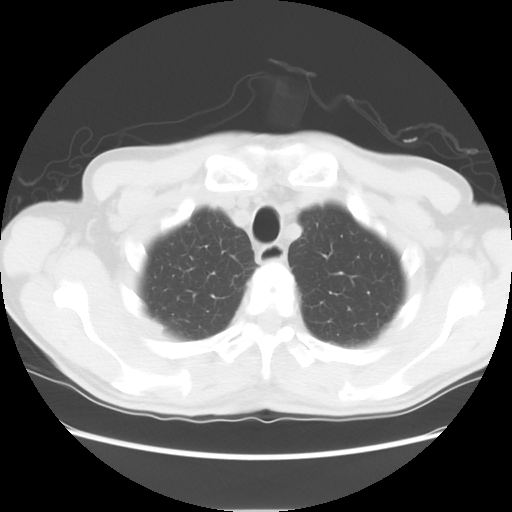

[Series 4: lung windows · axial · 0.72mm/px · z∈[-285,-40]mm · 5 of 75 slices shown]
[im 13/75  lung]
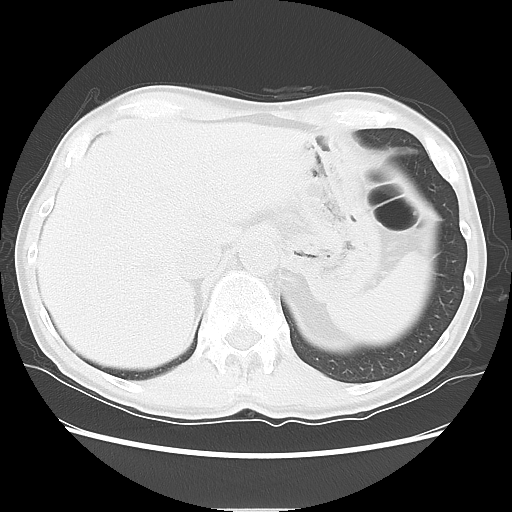
[im 25/75  lung]
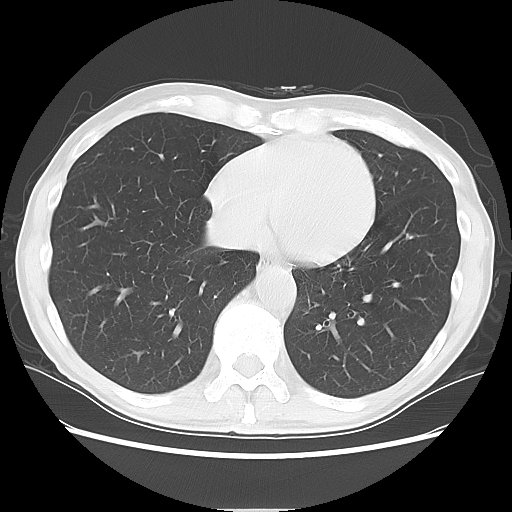
[im 38/75  lung]
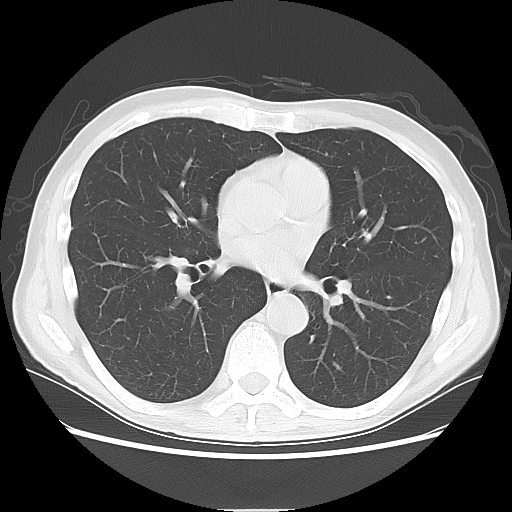
[im 50/75  lung]
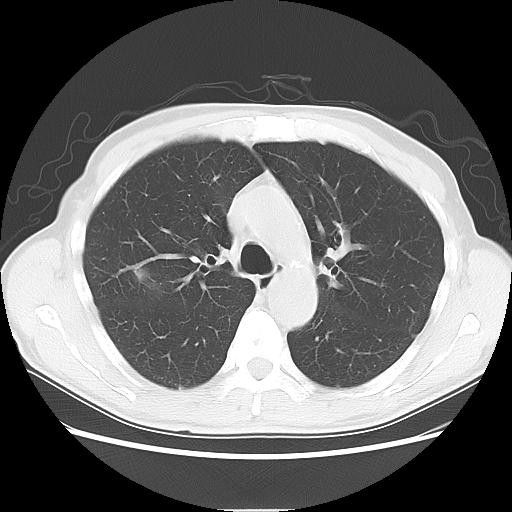
[im 62/75  lung]
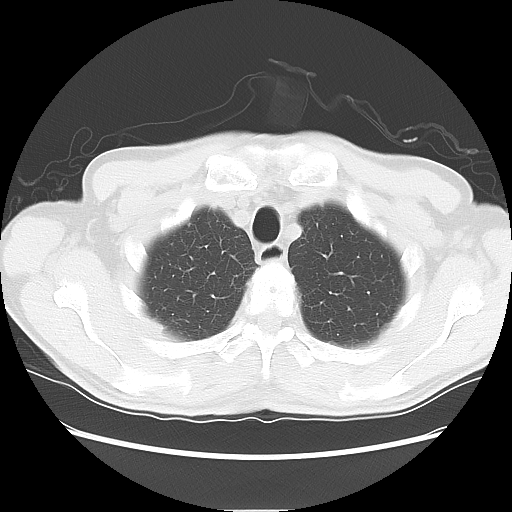

[Series 602: sagittal body · sagittal · 0.73mm/px · 7 of 149 slices shown]
[im 12/149  mediastinal]
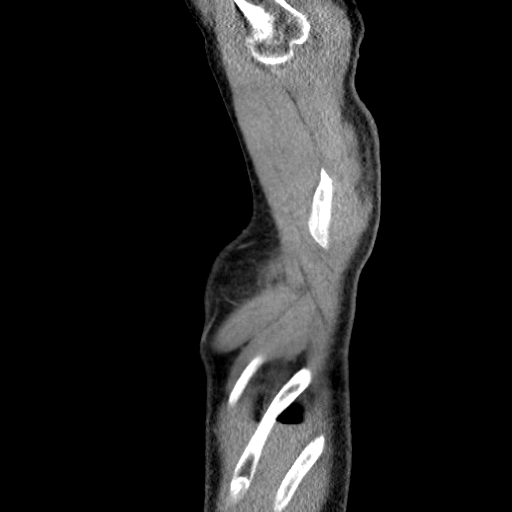
[im 35/149  mediastinal]
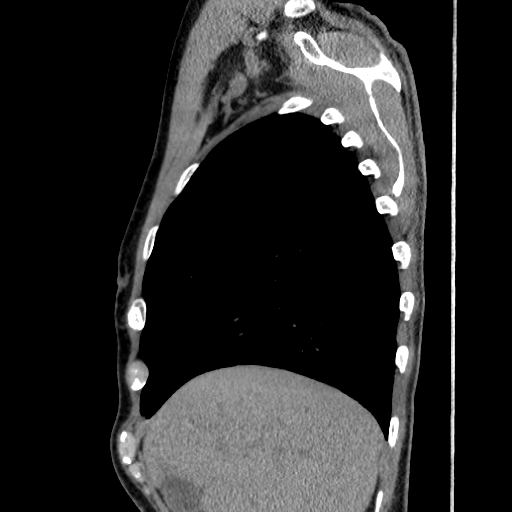
[im 46/149  mediastinal]
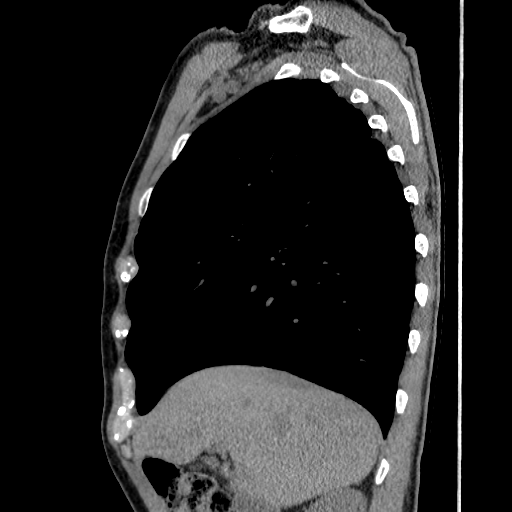
[im 69/149  mediastinal]
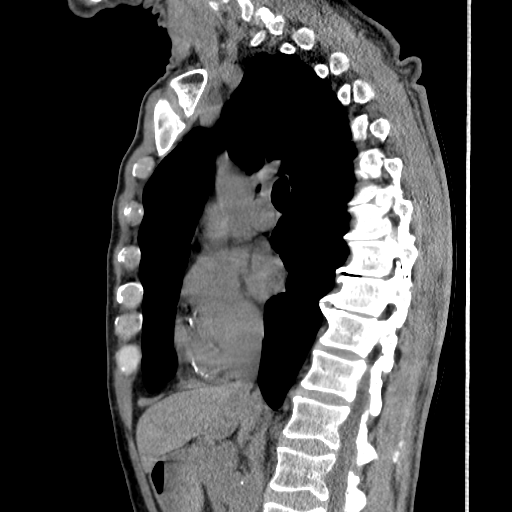
[im 80/149  mediastinal]
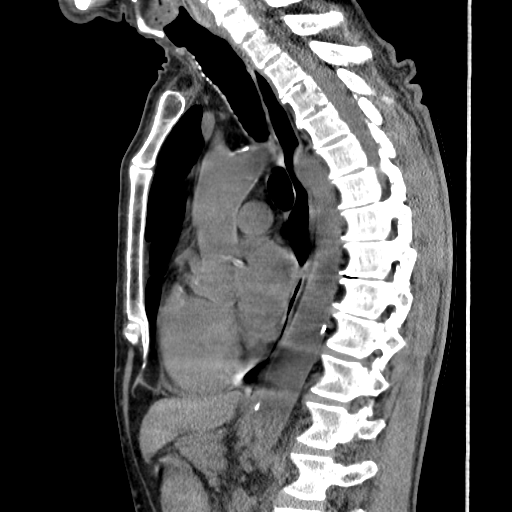
[im 103/149  mediastinal]
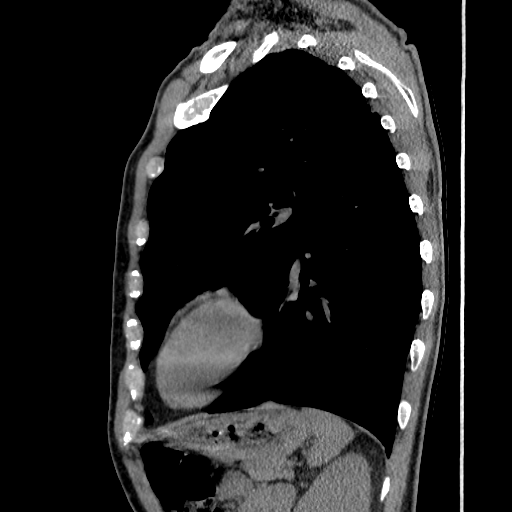
[im 114/149  mediastinal]
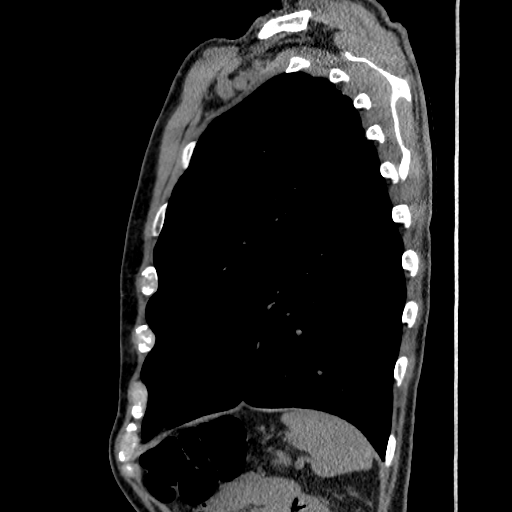

[17 of 30 positions shown; findings below may reference images not displayed]

FINDINGS: Multilevel degenerative disc disease is noted in the mid thoracic
spine. No pneumothorax or pleural effusion is noted. Stable flat
nodular density is noted along right minor fissure which can be
considered benign at this point with no further follow-up required.
Left lung is clear. There are noted 2 adjacent areas of ground-glass
opacity seen inferiorly in the right upper lobe which are more
prominent compared to prior exam. No definite solid component is
noted. Atherosclerosis of thoracic aorta is noted without aneurysm
formation. Status post coronary artery stent placement. No
mediastinal mass or adenopathy is noted. Visualized portion of upper
abdomen is unremarkable.
IMPRESSION: Stable flat nodular density is noted along right minor fissure
compared to prior exam, and this can be considered benign at this
point with no further follow-up required.

However, two adjacent areas of ground-glass opacity are noted
inferiorly in the right upper lobe without solid component. Followup
with unenhanced chest CT scan in 3 months is recommended to evaluate
for persistence of these abnormalities.

## 2018-04-20 ENCOUNTER — Telehealth: Payer: Self-pay

## 2018-04-20 NOTE — Telephone Encounter (Signed)
Virtual Visit Pre-Appointment Phone Call  Steps For Call:  1. Confirm consent - "In the setting of the current Covid19 crisis, you are scheduled for a (phone or video) visit with your provider on (date) at (time).  Just as we do with many in-office visits, in order for you to participate in this visit, we must obtain consent.  If you'd like, I can send this to your mychart (if signed up) or email for you to review.  Otherwise, I can obtain your verbal consent now.  All virtual visits are billed to your insurance company just like a normal visit would be.  By agreeing to a virtual visit, we'd like you to understand that the technology does not allow for your provider to perform an examination, and thus may limit your provider's ability to fully assess your condition.  Finally, though the technology is pretty good, we cannot assure that it will always work on either your or our end, and in the setting of a video visit, we may have to convert it to a phone-only visit.  In either situation, we cannot ensure that we have a secure connection.  Are you willing to proceed?"  2. Give patient instructions for WebEx download to smartphone as below if video visit  3. Advise patient to be prepared with any vital sign or heart rhythm information, their current medicines, and a piece of paper and pen handy for any instructions they may receive the day of their visit  4. Inform patient they will receive a phone call 15 minutes prior to their appointment time (may be from unknown caller ID) so they should be prepared to answer  5. Confirm that appointment type is correct in Epic appointment notes (video vs telephone)    TELEPHONE CALL NOTE  Billy Adams has been deemed a candidate for a follow-up tele-health visit to limit community exposure during the Covid-19 pandemic. I spoke with the patient via phone to ensure availability of phone/video source, confirm preferred email & phone number, and discuss  instructions and expectations.  I reminded Billy Adams to be prepared with any vital sign and/or heart rhythm information that could potentially be obtained via home monitoring, at the time of his visit. I reminded Billy Adams to expect a phone call at the time of his visit if his visit.  Did the patient verbally acknowledge consent to treatment? yes  Clide Dales Branston Halsted, CMA 04/20/2018 11:47 AM   DOWNLOADING THE WEBEX SOFTWARE TO SMARTPHONE  - If Apple, go to [employer] and type in WebEx in the search bar. Download [employer], the blue/green circle. The app is free but as with any other app downloads, their phone may require them to verify saved payment information or Apple password. The patient does NOT have to create an account.  - If Android, ask patient to go to [employer] and type in WebEx in the search bar. Download [employer], the blue/green circle. The app is free but as with any other app downloads, their phone may require them to verify saved payment information or Android password. The patient does NOT have to create an account.   CONSENT FOR TELE-HEALTH VISIT - PLEASE REVIEW  I hereby voluntarily request, consent and authorize [facility] and its employed or contracted physicians, physician assistants, nurse practitioners or other licensed health care professionals (the Practitioner), to provide me with telemedicine health care services (the "Services") as deemed necessary by the treating Practitioner. I acknowledge and  consent to receive the Services by the Practitioner via telemedicine. I understand that the telemedicine visit will involve communicating with the Practitioner through live audiovisual communication technology and the disclosure of certain medical information by electronic transmission. I acknowledge that I have been given the opportunity to request an in-person assessment or other available alternative prior to the telemedicine  visit and am voluntarily participating in the telemedicine visit.  I understand that I have the right to withhold or withdraw my consent to the use of telemedicine in the course of my care at any time, without affecting my right to future care or treatment, and that the Practitioner or I may terminate the telemedicine visit at any time. I understand that I have the right to inspect all information obtained and/or recorded in the course of the telemedicine visit and may receive copies of available information for a reasonable fee.  I understand that some of the potential risks of receiving the Services via telemedicine include:  Marland Kitchen Delay or interruption in medical evaluation due to technological equipment failure or disruption; . Information transmitted may not be sufficient (e.g. poor resolution of images) to allow for appropriate medical decision making by the Practitioner; and/or  . In rare instances, security protocols could fail, causing a breach of personal health information.  Furthermore, I acknowledge that it is my responsibility to provide information about my medical history, conditions and care that is complete and accurate to the best of my ability. I acknowledge that Practitioner's advice, recommendations, and/or decision may be based on factors not within their control, such as incomplete or inaccurate data provided by me or distortions of diagnostic images or specimens that may result from electronic transmissions. I understand that the practice of medicine is not an exact science and that Practitioner makes no warranties or guarantees regarding treatment outcomes. I acknowledge that I will receive a copy of this consent concurrently upon execution via email to the email address I last provided but may also request a printed copy by calling the office of [facility].    I understand that my insurance will be billed for this visit.   I have read or had this consent read to me. . I  understand the contents of this consent, which adequately explains the benefits and risks of the Services being provided via telemedicine.  . I have been provided ample opportunity to ask questions regarding this consent and the Services and have had my questions answered to my satisfaction. . I give my informed consent for the services to be provided through the use of telemedicine in my medical care  By participating in this telemedicine visit I agree to the above.

## 2018-04-21 ENCOUNTER — Other Ambulatory Visit: Payer: Self-pay

## 2018-04-21 ENCOUNTER — Telehealth (INDEPENDENT_AMBULATORY_CARE_PROVIDER_SITE_OTHER): Payer: Medicare Other | Admitting: Interventional Cardiology

## 2018-04-21 ENCOUNTER — Encounter: Payer: Self-pay | Admitting: Interventional Cardiology

## 2018-04-21 VITALS — BP 118/68 | HR 56 | Ht 74.0 in | Wt 170.0 lb

## 2018-04-21 DIAGNOSIS — I25119 Atherosclerotic heart disease of native coronary artery with unspecified angina pectoris: Secondary | ICD-10-CM

## 2018-04-21 DIAGNOSIS — E782 Mixed hyperlipidemia: Secondary | ICD-10-CM

## 2018-04-21 DIAGNOSIS — I700 Atherosclerosis of aorta: Secondary | ICD-10-CM

## 2018-04-21 DIAGNOSIS — Z955 Presence of coronary angioplasty implant and graft: Secondary | ICD-10-CM

## 2018-04-21 DIAGNOSIS — I493 Ventricular premature depolarization: Secondary | ICD-10-CM

## 2018-04-21 DIAGNOSIS — I10 Essential (primary) hypertension: Secondary | ICD-10-CM | POA: Diagnosis not present

## 2018-04-21 MED ORDER — ASPIRIN EC 81 MG PO TBEC: 81.0000 mg | DELAYED_RELEASE_TABLET | Freq: Every day | ORAL | 3 refills | Status: AC

## 2018-04-21 NOTE — Progress Notes (Signed)
Virtual Visit via Video Note    Evaluation Performed:  Follow-up visit  This visit type was conducted due to national recommendations for restrictions regarding the COVID-19 Pandemic (e.g. social distancing).  This format is felt to be most appropriate for this patient at this time.  All issues noted in this document were discussed and addressed.  No physical exam was performed (except for noted visual exam findings with Video Visits).  Please refer to the patient's chart (MyChart message for video visits and phone note for telephone visits) for the patient's consent to telehealth for [facility].  Date:  04/21/2018   ID:  Billy Adams, DOB 10-05-1945, MRN [id number]  Patient Location:  Home  Provider location:   [place], [place]  PCP:  Mosetta Putt, MD  Cardiologist:  No primary care provider on file. Dickson Kostelnik Electrophysiologist:  None   Chief Complaint:  CAD  History of Present Illness:    Billy Adams is a 73 y.o. male who presents via Web designer for a telehealth visit today.    He has a h/o PVCs and CAD. Since high school, he can remember feeling palpitations with stressful events (public speaking, band solo, etc).Hebegan to notice irregularity in his pulse after exercise. He was seen by Dr. Duaine Dredge and had a holter monitor in 7/14 that showed frequent PVCs (7% of total complexes). He cut caffeine out almost entirely but continued to have PVCs with and after exercise, seeral years ago.  This improved a few years later- see below.  He had 2 DES to the LAD in 2014. Other mild disease noted. Only sx he could think of were pain between the shoulder blades with exercise. PVCs were the biggest indicator at that time.   PVCs decreased after PCI.  He has had diagnosis ofaortic atherosclerosis and had a small AAA, diagnosed in 2012.5/18 ultrasound showed: Mildly dilated aorta (up to 2.8 cm), no intervention required. 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     The patient does not have symptoms concerning for COVID-19 infection (fever, chills, cough, or new shortness of breath).   Since the last visit, he has felt well.    Denies : Chest pain. Dizziness. Leg edema. Nitroglycerin use. Orthopnea. Palpitations. Paroxysmal nocturnal dyspnea. Shortness of breath. Syncope.   Mostly vegan diet.    He has had occasional right lower quadrant pains.  He would skip his aspirin and eat something and pain.  No bleeding problems.  He took Zantac prn in the past for GERD.   He walks regularly.  No problems going up hills.     Prior CV studies:   The following studies were reviewed today:     Past Medical History:  Diagnosis Date  . AAA (abdominal aortic aneurysm) (H[facility])    pt denies  . Allergy    seasonal  . Arthralgia of foot    bilateral  . BPH (benign prostatic hypertrophy) 1999  . CAD (coronary artery disease), residual post PCI with 40% LM, 30% LCX 12/25/2012  . Cataract    surgery  . Coronary artery disease    DES TO LAD X2  . Degenerative disk disease    of cervical spine w/occasional radicular symptoms since 1985  . Detached retina 2003  . Detached retina    When right cataract was repaired, so Dr. Luciana Axe has recommened delaying the left side repair longer so there will be less chance of detachedment of the retina again.  Marland Kitchen GERD (gastroesophageal reflux disease)  occasionally-relief with tums or zantac  . Hypercholesterolemia   . Hyperlipidemia 1995  . Hypertension   . Impaired glucose tolerance 2011  . Lactose intolerance   . Left cataract   . Osteoarthritis of both feet   . Palpitations    Pt has noticed an increase of palps lately, especially after exercise  . Prostate nodule 2009   Seeing Dr. Laverle Patter. Had another biopsy last year wich was benign. PSAs have been stable.  Marland Kitchen PVC's (premature ventricular contractions)    Holter monitor 07/27/12 showed frequent PVCs, representing about 7% of his beats.  Has improved since we reduced his caffeine.  . Reflux esophagitis 2004   Improved over last year  . S/P coronary artery stent placement -Prox LAD, DES 01/31/13  02/01/2013  . Sleeping difficulties 2002   2-3 nights a wk. Takes Xanax & Melatonin PRN.  Marland Kitchen Varicose vein    Past Surgical History:  Procedure Laterality Date  . CARDIAC CATHETERIZATION  01/31/2013   DR MCLEAN  . CATARACT EXTRACTION W/ INTRAOCULAR LENS IMPLANT Right 2001  . CORONARY ANGIOPLASTY  01/31/2013   DES TO LAD X2  DR MACELHANY  . LEFT HEART CATHETERIZATION WITH CORONARY ANGIOGRAM Bilateral 01/31/2013   Procedure: LEFT HEART CATHETERIZATION WITH CORONARY ANGIOGRAM;  Surgeon: Laurey Morale, MD;  Location: [facility] CATH LAB;  Service: Cardiovascular;  Laterality: Bilateral;  . LESION REMOVAL  11/25/2011   Procedure: MINOR EXICISION OF LESION;  Surgeon: Wyn Forster., MD;  Location: [facility];  Service: Orthopedics;  Laterality: Right;  REMOVE THORN RIGHT INDEX FINGER  . PERCUTANEOUS CORONARY STENT INTERVENTION (PCI-S) Left 01/31/2013   Procedure: PERCUTANEOUS CORONARY STENT INTERVENTION (PCI-S);  Surgeon: Laurey Morale, MD;  Location: [facility] CATH LAB;  Service: Cardiovascular;  Laterality: Left;  des x2 prox lad, ffr  . RETINAL DETACHMENT SURGERY Right 2003   With Sling Procedure  . TONSILLECTOMY  1950     Current Meds  Medication Sig  . aspirin 81 MG tablet Take 81 mg by mouth every other day.   . cholecalciferol (VITAMIN D) 1000 units tablet Take 1,000 Units by mouth daily.  . Coenzyme Q10 (CO Q 10) 100 MG CAPS Take 1 capsule by mouth daily.   Marland Kitchen GRAPE SEED CR PO Take 2 capsules by mouth daily. Muscadine grape seed (resveratol)  . MELATONIN PO Take 1 tablet by mouth as needed (for sleep).  . Multiple Vitamins-Minerals (MULTIVITAMIN WITH MINERALS) tablet Take 1 tablet by mouth daily.  . nitroGLYCERIN (NITROSTAT) 0.4 MG SL tablet Place 1 tablet (0.4 mg total) under the tongue every 5 (five) minutes as needed  for chest pain.  . Omega-3 Fatty Acids (FISH OIL PO) Take 1 capsule by mouth daily.  . rosuvastatin (CRESTOR) 5 MG tablet Take 5 mg by mouth daily.  . vitamin B-12 (CYANOCOBALAMIN) 1000 MCG tablet Take 1,000 mcg by mouth daily.     Allergies:   Other   Social History   Tobacco Use  . Smoking status: Never Smoker  . Smokeless tobacco: Never Used  Substance Use Topics  . Alcohol use: Yes    Alcohol/week: 0.0 standard drinks    Comment: occasionally, 2-3 a month  . Drug use: No     Family Hx: The patient's family history includes Anxiety disorder in his sister; Atrial fibrillation (age of onset: 91) in his mother; Bipolar disorder in his sister; CVA in his mother; Coronary artery disease in his father; Heart attack in his father; Heart disease in his  father; Seasonal affective disorder in his sister. There is no history of Colon cancer.  ROS:   Please see the history of present illness.    Some allergy sx. All other systems reviewed and are negative.   Labs/Other Tests and Data Reviewed:    Recent Labs: No results found for requested labs within last 8760 hours.   Recent Lipid Panel Lab Results  Component Value Date/Time   CHOL 104 04/26/2014 08:50 AM   TRIG 50.0 04/26/2014 08:50 AM   HDL 37.90 (L) 04/26/2014 08:50 AM   CHOLHDL 3 04/26/2014 08:50 AM   LDLCALC 56 04/26/2014 08:50 AM    Wt Readings from Last 3 Encounters:  04/21/18 170 lb (77.1 kg)  04/21/17 171 lb 6.4 oz (77.7 kg)  04/21/16 172 lb (78 kg)     Objective:    Vital Signs:  BP 118/68   Pulse (!) 56   Ht  (1.88 m)   Wt 170 lb (77.1 kg)   BMI 21.83 kg/m    Well nourished, well developed male in no acute distress. No apparent shortness of breath  ASSESSMENT & PLAN:    1.  CAD: No Angina.  Continue aggressive secondary prevention.  GO back to aspirin daily.  Stents placed in 2014.   If abdoiminal pain returns, may have to change aspirin dose back, but it seems unlikely that the two are  related.  No bleeding issues.   PVCs: No sx.    Hyperlipidemia: Followed by Dr. Duaine Dredge.  Continue Crestor.  He was concerned about the memory effects  Aortic atherosclerosis: Keep LDL < 100.  Continue rosuvastatin.  I don't think his RLQ pain is related aortic atherosclerosis.  BP controlled at home when he checks at home.    COVID-19 Education: The signs and symptoms of COVID-19 were discussed with the patient and how to seek care for testing (follow up with PCP or arrange E-visit).  The importance of social distancing was discussed today.  Patient Risk:   After full review of this patient's clinical status, I feel that they are at least moderate risk at this time.  Time:   Today, I have spent 25 minutes with the patient with telehealth technology discussing CAD, COVID.     Medication Adjustments/Labs and Tests Ordered: Current medicines are reviewed at length with the patient today.  Concerns regarding medicines are outlined above.  Tests Ordered: No orders of the defined types were placed in this encounter.  Medication Changes: No orders of the defined types were placed in this encounter.   Disposition:  Follow up in 1 year(s)  Signed, Lance Muss, MD  04/21/2018 1:35 PM    [facility]

## 2018-04-21 NOTE — Patient Instructions (Signed)
Medication Instructions:  Your physician has recommended you make the following change in your medication:   TAKE: Aspirin 81 mg every day  You may take Over The Counter Prilosec and Over The Counter Claritin   Lab work: None Ordered  If you have labs (blood work) drawn today and your tests are completely normal, you will receive your results only by: Marland Kitchen MyChart Message (if you have MyChart) OR . A paper copy in the mail If you have any lab test that is abnormal or we need to change your treatment, we will call you to review the results.  Testing/Procedures: None ordered  Follow-Up: At [facility], you and your health needs are our priority.  As part of our continuing mission to provide you with exceptional heart care, we have created designated Provider Care Teams.  These Care Teams include your primary Cardiologist (physician) and Advanced Practice Providers (APPs -  Physician Assistants and Nurse Practitioners) who all work together to provide you with the care you need, when you need it. . You will need a follow up appointment in 1 year.  Please call our office 2 months in advance to schedule this appointment.  You may see Everette Rank, MD or one of the following Advanced Practice Providers on your designated Care Team:   . Robbie Lis, PA-C . Dayna Dunn, PA-C . Jacolyn Reedy, PA-C  Any Other Special Instructions Will Be Listed Below (If Applicable).

## 2019-05-15 NOTE — Progress Notes (Signed)
Cardiology Office Note   Date:  05/16/2019   ID:  Maria Gallicchio, DOB 12-Nov-1945, MRN [id number]  PCP:  Derinda Late, MD    No chief complaint on file.  CAD  Wt Readings from Last 3 Encounters:  05/16/19 173 lb 6.4 oz (78.7 kg)  04/21/18 170 lb (77.1 kg)  04/21/17 171 lb 6.4 oz (77.7 kg)       History of Present Illness: Billy Adams is a 74 y.o. male  who has a h/oPVCs and CAD. Since high school, he can remember feeling palpitations with stressful events (public speaking, band solo, etc).Hebegan to notice irregularity in his pulse after exercise. He was seen by Dr. Sandi Mariscal and had a holter monitor in 7/14 that showed frequent PVCs (7% of total complexes). He cut caffeine out almost entirely but continued to have PVCs with and after exercise, seeral years ago. This improved a few years later- see below.  He had 2 DES to the LAD in 2014. Other mild disease noted. Only sx he could think of were pain between the shoulder blades with exercise. PVCs were the biggest indicator at that time.   PVCs decreased after PCI.  He has had diagnosis ofaortic atherosclerosis and had a small AAA, diagnosed in 2012.5/18 ultrasound showed:Mildly dilated aorta(up to 2.8 cm), no intervention required. 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   Crestor increased to 10 mg daily in 2021.  Since the last visit, he feels his stamina is decreased.  If he walks soon after dinner, he gets some back pain with walking that is reminiscent of his angina.  If he walks before eating, he has no problem.  Back massager helps.    He has been active in the yard moving mulch, without any cardiac sx.   Past Medical History:  Diagnosis Date  . AAA (abdominal aortic aneurysm) ([facility])    pt denies  . Allergy    seasonal  . Arthralgia of foot    bilateral  . BPH (benign prostatic hypertrophy) 1999  . CAD (coronary artery disease), residual post PCI with 40% LM, 30% LCX 12/25/2012    . Cataract    surgery  . Coronary artery disease    DES TO LAD X2  . Degenerative disk disease    of cervical spine w/occasional radicular symptoms since 1985  . Detached retina 2003  . Detached retina    When right cataract was repaired, so Dr. Zadie Rhine has recommened delaying the left side repair longer so there will be less chance of detachedment of the retina again.  Marland Kitchen GERD (gastroesophageal reflux disease)    occasionally-relief with tums or zantac  . Hypercholesterolemia   . Hyperlipidemia 1995  . Hypertension   . Impaired glucose tolerance 2011  . Lactose intolerance   . Left cataract   . Osteoarthritis of both feet   . Palpitations    Pt has noticed an increase of palps lately, especially after exercise  . Prostate nodule 2009   Seeing Dr. Alinda Money. Had another biopsy last year wich was benign. PSAs have been stable.  Marland Kitchen PVC's (premature ventricular contractions)    Holter monitor 07/27/12 showed frequent PVCs, representing about 7% of his beats. Has improved since we reduced his caffeine.  . Reflux esophagitis 2004   Improved over last year  . S/P coronary artery stent placement -Prox LAD, DES 01/31/13  02/01/2013  . Sleeping difficulties 2002   2-3 nights a wk. Takes Xanax & Melatonin PRN.  Marland Kitchen Varicose vein  Past Surgical History:  Procedure Laterality Date  . CARDIAC CATHETERIZATION  01/31/2013   DR MCLEAN  . CATARACT EXTRACTION W/ INTRAOCULAR LENS IMPLANT Right 2001  . CORONARY ANGIOPLASTY  01/31/2013   DES TO LAD X2  DR MACELHANY  . LEFT HEART CATHETERIZATION WITH CORONARY ANGIOGRAM Bilateral 01/31/2013   Procedure: LEFT HEART CATHETERIZATION WITH CORONARY ANGIOGRAM;  Surgeon: Laurey Morale, MD;  Location: [facility] CATH LAB;  Service: Cardiovascular;  Laterality: Bilateral;  . LESION REMOVAL  11/25/2011   Procedure: MINOR EXICISION OF LESION;  Surgeon: Wyn Forster., MD;  Location: [facility];  Service: Orthopedics;  Laterality: Right;  REMOVE THORN  RIGHT INDEX FINGER  . PERCUTANEOUS CORONARY STENT INTERVENTION (PCI-S) Left 01/31/2013   Procedure: PERCUTANEOUS CORONARY STENT INTERVENTION (PCI-S);  Surgeon: Laurey Morale, MD;  Location[facility] CATH LAB;  Service: Cardiovascular;  Laterality: Left;  des x2 prox lad, ffr  . RETINAL DETACHMENT SURGERY Right 2003   With Sling Procedure  . TONSILLECTOMY  1950     Current Outpatient Medications  Medication Sig Dispense Refill  . aspirin EC 81 MG tablet Take 1 tablet (81 mg total) by mouth daily. 90 tablet 3  . cholecalciferol (VITAMIN D) 1000 units tablet Take 1,000 Units by mouth daily.    . Coenzyme Q10 (CO Q 10) 100 MG CAPS Take 1 capsule by mouth daily.     Marland Kitchen GRAPE SEED CR PO Take 2 capsules by mouth daily. Muscadine grape seed (resveratol)    . MELATONIN PO Take 1 tablet by mouth as needed (for sleep).    . Multiple Vitamins-Minerals (MULTIVITAMIN WITH MINERALS) tablet Take 1 tablet by mouth daily.    . nitroGLYCERIN (NITROSTAT) 0.4 MG SL tablet Place 1 tablet (0.4 mg total) under the tongue every 5 (five) minutes as needed for chest pain. 100 tablet 3  . Omega-3 Fatty Acids (FISH OIL PO) Take 1 capsule by mouth daily.    . rosuvastatin (CRESTOR) 10 MG tablet Take 10 mg by mouth daily.    . vitamin B-12 (CYANOCOBALAMIN) 1000 MCG tablet Take 1,000 mcg by mouth daily.     No current facility-administered medications for this visit.    Allergies:   Other    Social History:  The patient  reports that he has never smoked. He has never used smokeless tobacco. He reports current alcohol use. He reports that he does not use drugs.   Family History:  The patient's family history includes Anxiety disorder in his sister; Atrial fibrillation (age of onset: 37) in his mother; Bipolar disorder in his sister; CVA in his mother; Coronary artery disease in his father; Heart attack in his father; Heart disease in his father; Seasonal affective disorder in his sister.    ROS:  Please see the history of  present illness.   Otherwise, review of systems are positive for back pain, vegan diet.   All other systems are reviewed and negative.    PHYSICAL EXAM: VS:  BP 116/62   Pulse 63   Ht 6\' 2"  (1.88 m)   Wt 173 lb 6.4 oz (78.7 kg)   SpO2 96%   BMI 22.26 kg/m  , BMI Body mass index is 22.26 kg/m. GEN: Well nourished, well developed, in no acute distress  HEENT: normal  Neck: no JVD, carotid bruits, or masses Cardiac: RRR; no murmurs, rubs, or gallops,no edema  Respiratory:  clear to auscultation bilaterally, normal work of breathing GI: soft, nontender, nondistended, + BS MS: no deformity  or atrophy  Skin: warm and dry, no rash Neuro:  Strength and sensation are intact Psych: euthymic mood, full affect   EKG:   The ekg ordered by PMD in 07/2018 demonstrates NSR, no ST changes   Recent Labs: No results found for requested labs within last 8760 hours.   Lipid Panel    Component Value Date/Time   CHOL 104 04/26/2014 0850   TRIG 50.0 04/26/2014 0850   HDL 37.90 (L) 04/26/2014 0850   CHOLHDL 3 04/26/2014 0850   VLDL 10.0 04/26/2014 0850   LDLCALC 56 04/26/2014 0850     Other studies Reviewed: Additional studies/ records that were reviewed today with results demonstrating: .   ASSESSMENT AND PLAN:  1. CAD: Plan for nuclear stress test.  He has not had any symptoms in quite a while.  Given this new back pain, would evaluate for ischemia. 2. PVCs: He has not had any PVCs recently.  Symptoms controlled. 3. Hyperlipidemia: Try to add more fiber to his diet.  His LDL and total cholesterol are well controlled.  Particle number was a little above target.  A dose of Metamucil daily may help. 4. Aortic atherosclerosis: Continue lipid-lowering therapy. 5. HTN: The current medical regimen is effective;  continue present plan and medications.   Current medicines are reviewed at length with the patient today.  The patient concerns regarding his medicines were addressed.  The  following changes have been made:  No change  Labs/ tests ordered today include:  No orders of the defined types were placed in this encounter.   Recommend 150 minutes/week of aerobic exercise Low fat, low carb, high fiber diet recommended  Disposition:   FU for stress test   Signed, Lance Muss, MD  05/16/2019 2:36 PM    [facility] [address] [place], [place], [place]  [address] Phone: [phone]; Fax: [phone]

## 2019-05-16 ENCOUNTER — Encounter: Payer: Self-pay | Admitting: Interventional Cardiology

## 2019-05-16 ENCOUNTER — Other Ambulatory Visit: Payer: Self-pay

## 2019-05-16 ENCOUNTER — Ambulatory Visit: Payer: Medicare PPO | Admitting: Interventional Cardiology

## 2019-05-16 VITALS — BP 116/62 | HR 63 | Ht 74.0 in | Wt 173.4 lb

## 2019-05-16 DIAGNOSIS — I25119 Atherosclerotic heart disease of native coronary artery with unspecified angina pectoris: Secondary | ICD-10-CM

## 2019-05-16 DIAGNOSIS — E782 Mixed hyperlipidemia: Secondary | ICD-10-CM

## 2019-05-16 DIAGNOSIS — I10 Essential (primary) hypertension: Secondary | ICD-10-CM

## 2019-05-16 DIAGNOSIS — I493 Ventricular premature depolarization: Secondary | ICD-10-CM

## 2019-05-16 DIAGNOSIS — I25118 Atherosclerotic heart disease of native coronary artery with other forms of angina pectoris: Secondary | ICD-10-CM | POA: Diagnosis not present

## 2019-05-16 DIAGNOSIS — I700 Atherosclerosis of aorta: Secondary | ICD-10-CM

## 2019-05-16 NOTE — Patient Instructions (Signed)
Medication Instructions:  Your physician recommends that you continue on your current medications as directed. Please refer to the Current Medication list given to you today.  *If you need a refill on your cardiac medications before your next appointment, please call your pharmacy*   Lab Work: None ordered  If you have labs (blood work) drawn today and your tests are completely normal, you will receive your results only by: Marland Kitchen MyChart Message (if you have MyChart) OR . A paper copy in the mail If you have any lab test that is abnormal or we need to change your treatment, we will call you to review the results.   Testing/Procedures: You will need a COVID test done prior to your stress test. Your Pre-procedure COVID-19 Testing will be done on ___ at ___ at [facility] at [address], [place], [place] [address]. Once you arrive at the testing site, stay in the right hand lane, go under the building overhang not the tent. If you are tested under the tent your results may not be back before your procedure. Please be on time for your appointment.  After your swab you will be given a mask to wear and instructed to go home and quarantine/no visitors until after your procedure. If you test positive you will be notified and your procedure will be cancelled.    Your physician has requested that you have en exercise stress myoview. For further information please visit [email or web address] Please follow instruction sheet, as given.   Follow-Up: At [facility], you and your health needs are our priority.  As part of our continuing mission to provide you with exceptional heart care, we have created designated Provider Care Teams.  These Care Teams include your primary Cardiologist (physician) and Advanced Practice Providers (APPs -  Physician Assistants and Nurse Practitioners) who all work together to provide you with the care you need, when you need it.  We recommend  signing up for the patient portal called "MyChart".  Sign up information is provided on this After Visit Summary.  MyChart is used to connect with patients for Virtual Visits (Telemedicine).  Patients are able to view lab/test results, encounter notes, upcoming appointments, etc.  Non-urgent messages can be sent to your provider as well.   To learn more about what you can do with MyChart, go to [email or web address].    Your next appointment:   12 month(s)  The format for your next appointment:   In Person  Provider:   You may see Lance Muss, MD or one of the following Advanced Practice Providers on your designated Care Team:    Ronie Spies, PA-C  Jacolyn Reedy, PA-C    Other Instructions

## 2019-05-24 ENCOUNTER — Telehealth (HOSPITAL_COMMUNITY): Payer: Self-pay

## 2019-05-24 NOTE — Telephone Encounter (Signed)
Spoke with the patient, detailed instructions given, He stated that he would be here for his test and understood the instructions. Asked to call back with any questions. S.Janitza Revuelta EMTP

## 2019-05-25 ENCOUNTER — Other Ambulatory Visit (HOSPITAL_COMMUNITY)
Admission: RE | Admit: 2019-05-25 | Discharge: 2019-05-25 | Disposition: A | Discharge status: Home / Self Care | Payer: Medicare PPO | Source: Ambulatory Visit | Attending: Interventional Cardiology | Admitting: Interventional Cardiology

## 2019-05-25 DIAGNOSIS — Z01812 Encounter for preprocedural laboratory examination: Secondary | ICD-10-CM | POA: Diagnosis present

## 2019-05-25 DIAGNOSIS — Z20822 Contact with and (suspected) exposure to covid-19: Secondary | ICD-10-CM | POA: Diagnosis not present

## 2019-05-25 LAB — SARS CORONAVIRUS 2 (TAT 6-24 HRS): SARS Coronavirus 2: NEGATIVE

## 2019-05-29 ENCOUNTER — Other Ambulatory Visit: Payer: Self-pay

## 2019-05-29 ENCOUNTER — Ambulatory Visit (HOSPITAL_COMMUNITY): Payer: Medicare PPO | Attending: Internal Medicine

## 2019-05-29 DIAGNOSIS — I25118 Atherosclerotic heart disease of native coronary artery with other forms of angina pectoris: Secondary | ICD-10-CM | POA: Insufficient documentation

## 2019-05-29 LAB — MYOCARDIAL PERFUSION IMAGING
Estimated workload: 8.6 METS
Exercise duration (min): 7 min
Exercise duration (sec): 3 s
LV dias vol: 115 mL (ref 62–150)
LV sys vol: 51 mL
MPHR: 144 {beats}/min
Peak HR: 141 {beats}/min
Percent HR: 97 %
Rest HR: 56 {beats}/min
SDS: 1
SRS: 0
SSS: 1
TID: 1.1

## 2019-05-29 MED ORDER — TECHNETIUM TC 99M TETROFOSMIN IV KIT
10.3000 | PACK | Freq: Once | INTRAVENOUS | Status: AC | PRN
  Administered 2019-05-29: 10.3 via INTRAVENOUS
  Filled 2019-05-29: qty 11

## 2019-05-29 MED ORDER — TECHNETIUM TC 99M TETROFOSMIN IV KIT
29.3000 | PACK | Freq: Once | INTRAVENOUS | Status: AC | PRN
  Administered 2019-05-29: 29.3 via INTRAVENOUS
  Filled 2019-05-29: qty 30

## 2019-10-31 DIAGNOSIS — H43391 Other vitreous opacities, right eye: Secondary | ICD-10-CM | POA: Insufficient documentation

## 2019-10-31 DIAGNOSIS — H43393 Other vitreous opacities, bilateral: Secondary | ICD-10-CM | POA: Insufficient documentation

## 2019-10-31 DIAGNOSIS — H35371 Puckering of macula, right eye: Secondary | ICD-10-CM | POA: Insufficient documentation

## 2019-10-31 DIAGNOSIS — H43811 Vitreous degeneration, right eye: Secondary | ICD-10-CM | POA: Insufficient documentation

## 2019-10-31 DIAGNOSIS — H43812 Vitreous degeneration, left eye: Secondary | ICD-10-CM | POA: Insufficient documentation

## 2019-10-31 DIAGNOSIS — Z961 Presence of intraocular lens: Secondary | ICD-10-CM | POA: Insufficient documentation

## 2019-11-13 ENCOUNTER — Encounter (INDEPENDENT_AMBULATORY_CARE_PROVIDER_SITE_OTHER): Payer: Self-pay | Admitting: Ophthalmology

## 2019-11-13 ENCOUNTER — Other Ambulatory Visit: Payer: Self-pay

## 2019-11-13 ENCOUNTER — Ambulatory Visit (INDEPENDENT_AMBULATORY_CARE_PROVIDER_SITE_OTHER): Payer: Medicare PPO | Admitting: Ophthalmology

## 2019-11-13 DIAGNOSIS — H43811 Vitreous degeneration, right eye: Secondary | ICD-10-CM

## 2019-11-13 DIAGNOSIS — H43393 Other vitreous opacities, bilateral: Secondary | ICD-10-CM | POA: Diagnosis not present

## 2019-11-13 DIAGNOSIS — Z961 Presence of intraocular lens: Secondary | ICD-10-CM

## 2019-11-13 DIAGNOSIS — H35371 Puckering of macula, right eye: Secondary | ICD-10-CM | POA: Diagnosis not present

## 2019-11-13 DIAGNOSIS — H43812 Vitreous degeneration, left eye: Secondary | ICD-10-CM | POA: Diagnosis not present

## 2019-11-13 DIAGNOSIS — H43391 Other vitreous opacities, right eye: Secondary | ICD-10-CM

## 2019-11-13 NOTE — Assessment & Plan Note (Signed)
Longstanding epiretinal membrane with only minor retinal thickening in the fovea but no visual acuity impact nor significant distortion.Careful examination of the outer retina fails to disclose any disruption in the photoreceptor layer.

## 2019-11-13 NOTE — Progress Notes (Signed)
11/13/2019     CHIEF COMPLAINT Patient presents for Retina Follow Up   HISTORY OF PRESENT ILLNESS: Billy Adams is a 74 y.o. male who presents to the clinic today for:   HPI    Retina Follow Up    Patient presents with  Other.  In both eyes.  This started 2 years ago.  Severity is mild.  Duration of 2 years.  Since onset it is gradually worsening.          Comments    2 Year F/U OU  Pt c/o difficulty seeing small print OU. Pt sts he has to be in "really good light" to see small print OU. DVA stable OU. Stable floaters OU. No flashes of light OU.       Last edited by Ileana Roup, COA on 11/13/2019  9:50 AM. (History)      Referring physician: Mosetta Putt, MD [address] [place],  [place] [address]  HISTORICAL INFORMATION:   Selected notes from the MEDICAL RECORD NUMBER       CURRENT MEDICATIONS: No current outpatient medications on file. (Ophthalmic Drugs)   No current facility-administered medications for this visit. (Ophthalmic Drugs)   Current Outpatient Medications (Other)  Medication Sig  . aspirin EC 81 MG tablet Take 1 tablet (81 mg total) by mouth daily.  . cholecalciferol (VITAMIN D) 1000 units tablet Take 1,000 Units by mouth daily.  . Coenzyme Q10 (CO Q 10) 100 MG CAPS Take 1 capsule by mouth daily.   Marland Kitchen GRAPE SEED CR PO Take 2 capsules by mouth daily. Muscadine grape seed (resveratol)  . MELATONIN PO Take 1 tablet by mouth as needed (for sleep).  . Multiple Vitamins-Minerals (MULTIVITAMIN WITH MINERALS) tablet Take 1 tablet by mouth daily.  . nitroGLYCERIN (NITROSTAT) 0.4 MG SL tablet Place 1 tablet (0.4 mg total) under the tongue every 5 (five) minutes as needed for chest pain.  . Omega-3 Fatty Acids (FISH OIL PO) Take 1 capsule by mouth daily.  . rosuvastatin (CRESTOR) 10 MG tablet Take 10 mg by mouth daily.  . vitamin B-12 (CYANOCOBALAMIN) 1000 MCG tablet Take 1,000 mcg by mouth daily.   No current facility-administered  medications for this visit. (Other)      REVIEW OF SYSTEMS:    ALLERGIES Allergies  Allergen Reactions  . Other     RYE> diarrhea, cats, tobacco smoke, alternaria    PAST MEDICAL HISTORY Past Medical History:  Diagnosis Date  . AAA (abdominal aortic aneurysm) (HCC)    pt denies  . Allergy    seasonal  . Arthralgia of foot    bilateral  . BPH (benign prostatic hypertrophy) 1999  . CAD (coronary artery disease), residual post PCI with 40% LM, 30% LCX 12/25/2012  . Cataract    surgery  . Coronary artery disease    DES TO LAD X2  . Degenerative disk disease    of cervical spine w/occasional radicular symptoms since 1985  . Detached retina 2003  . Detached retina    When right cataract was repaired, so Dr. Luciana Axe has recommened delaying the left side repair longer so there will be less chance of detachedment of the retina again.  Marland Kitchen GERD (gastroesophageal reflux disease)    occasionally-relief with tums or zantac  . Hypercholesterolemia   . Hyperlipidemia 1995  . Hypertension   . Impaired glucose tolerance 2011  . Lactose intolerance   . Left cataract   . Osteoarthritis of both feet   . Palpitations  Pt has noticed an increase of palps lately, especially after exercise  . Prostate nodule 2009   Seeing Dr. Laverle PatterBorden. Had another biopsy last year wich was benign. PSAs have been stable.  Marland Kitchen. PVC's (premature ventricular contractions)    Holter monitor 07/27/12 showed frequent PVCs, representing about 7% of his beats. Has improved since we reduced his caffeine.  . Reflux esophagitis 2004   Improved over last year  . S/P coronary artery stent placement -Prox LAD, DES 01/31/13  02/01/2013  . Sleeping difficulties 2002   2-3 nights a wk. Takes Xanax & Melatonin PRN.  Marland Kitchen. Varicose vein    Past Surgical History:  Procedure Laterality Date  . CARDIAC CATHETERIZATION  01/31/2013   DR MCLEAN  . CATARACT EXTRACTION W/ INTRAOCULAR LENS IMPLANT Right 2001  . CORONARY ANGIOPLASTY   01/31/2013   DES TO LAD X2  DR MACELHANY  . LEFT HEART CATHETERIZATION WITH CORONARY ANGIOGRAM Bilateral 01/31/2013   Procedure: LEFT HEART CATHETERIZATION WITH CORONARY ANGIOGRAM;  Surgeon: Laurey Moralealton S McLean, MD;  [facility];  Service: Cardiovascular;  Laterality: Bilateral;  . LESION REMOVAL  11/25/2011   Procedure: MINOR EXICISION OF LESION;  Surgeon: Wyn Forsterobert V Sypher Jr., MD;  Location: [facility];  Service: Orthopedics;  Laterality: Right;  REMOVE THORN RIGHT INDEX FINGER  . PERCUTANEOUS CORONARY STENT INTERVENTION (PCI-S) Left 01/31/2013   Procedure: PERCUTANEOUS CORONARY STENT INTERVENTION (PCI-S);  Surgeon: Laurey Moralealton S McLean, MD;  [facility];  Service: Cardiovascular;  Laterality: Left;  des x2 prox lad, ffr  . RETINAL DETACHMENT SURGERY Right 2003   With Sling Procedure  . TONSILLECTOMY  1950    FAMILY HISTORY Family History  Problem Relation Age of Onset  . Coronary artery disease Father   . Heart attack Father   . Heart disease Father        rheumatic heart disease  . CVA Mother   . Atrial fibrillation Mother [id number]  . Bipolar disorder Sister   . Seasonal affective disorder Sister   . Anxiety disorder Sister   . Colon cancer Neg Hx     SOCIAL HISTORY Social History   Tobacco Use  . Smoking status: Never Smoker  . Smokeless tobacco: Never Used  Substance Use Topics  . Alcohol use: Yes    Alcohol/week: 0.0 standard drinks    Comment: occasionally, 2-3 a month  . Drug use: No         OPHTHALMIC EXAM: Base Eye Exam    Visual Acuity (ETDRS)      Right Left   Dist cc 20/25 +2 20/20 -2   Correction: Glasses       Tonometry (Tonopen, 9:50 AM)      Right Left   Pressure 18 19       Pupils      Pupils Dark Light Shape React APD   Right PERRL 4 3 Round Brisk None   Left PERRL 4 3 Round Brisk None       Visual Fields (Counting fingers)      Left Right    Full Full       Extraocular Movement      Right Left    Full Full         Neuro/Psych    Oriented x3: Yes   Mood/Affect: Normal       Dilation    Both eyes: 1.0% Mydriacyl, 2.5% Phenylephrine @ 9:55 AM        Slit Lamp and Fundus Exam  External Exam      Right Left   External Normal Normal       Slit Lamp Exam      Right Left   Lids/Lashes Normal Normal   Conjunctiva/Sclera White and quiet White and quiet   Cornea Clear Clear   Anterior Chamber Deep and quiet Deep and quiet   Iris Round and reactive Round and reactive   Lens Posterior chamber intraocular lens, Open posterior capsule    Anterior Vitreous Normal Normal       Fundus Exam      Right Left   Posterior Vitreous Posterior vitreous detachment Posterior vitreous detachment   Disc Normal Normal   C/D Ratio 0.45 0.3   Macula Epiretinal membrane, moderate topographic distortion Normal   Vessels Normal Normal   Periphery Good scleral buckle, cryopexy, no new retinal breaks, a attached No retinal holes or tears          IMAGING AND PROCEDURES  Imaging and Procedures for 11/13/19  OCT, Retina - OU - Both Eyes       Right Eye Quality was good. Scan locations included subfoveal. Central Foveal Thickness: 403. Findings include epiretinal membrane.   Left Eye Quality was good. Scan locations included subfoveal. Central Foveal Thickness: 323. Progression has improved. Findings include normal foveal contour, no IRF, no SRF.                 ASSESSMENT/PLAN:  Right epiretinal membrane Longstanding epiretinal membrane with only minor retinal thickening in the fovea but no visual acuity impact nor significant distortion.Careful examination of the outer retina fails to disclose any disruption in the photoreceptor layer.      ICD-10-CM   1. Right epiretinal membrane  H35.371 OCT, Retina - OU - Both Eyes  2. Posterior vitreous detachment of right eye  H43.811 OCT, Retina - OU - Both Eyes  3. Other vitreous opacities, bilateral  H43.393   4. Posterior vitreous detachment of  left eye  H43.812 OCT, Retina - OU - Both Eyes  5. Pseudophakia, both eyes  Z96.1   6. Other vitreous opacities, right eye  H43.391     1.  History of retinal detachment right eye, minor epiretinal membrane stable over many years.  We will continue to observe  2.  3.  Ophthalmic Meds Ordered this visit:  No orders of the defined types were placed in this encounter.      Return in about 2 years (around 11/12/2021) for DILATE OU, OCT.  There are no Patient Instructions on file for this visit.   Explained the diagnoses, plan, and follow up with the patient and they expressed understanding.  Patient expressed understanding of the importance of proper follow up care.   Alford Highland Zabrina Brotherton M.D. Diseases & Surgery of the Retina and Vitreous [facility] 11/13/19     Abbreviations: M myopia (nearsighted); A astigmatism; H hyperopia (farsighted); P presbyopia; Mrx spectacle prescription;  CTL contact lenses; OD right eye; OS left eye; OU both eyes  XT exotropia; ET esotropia; PEK punctate epithelial keratitis; PEE punctate epithelial erosions; DES dry eye syndrome; MGD meibomian gland dysfunction; ATs artificial tears; PFAT's preservative free artificial tears; NSC nuclear sclerotic cataract; PSC posterior subcapsular cataract; ERM epi-retinal membrane; PVD posterior vitreous detachment; RD retinal detachment; DM diabetes mellitus; DR diabetic retinopathy; NPDR non-proliferative diabetic retinopathy; PDR proliferative diabetic retinopathy; CSME clinically significant macular edema; DME diabetic macular edema; dbh dot blot hemorrhages; CWS cotton wool spot; POAG primary open angle  glaucoma; C/D cup-to-disc ratio; HVF humphrey visual field; GVF goldmann visual field; OCT optical coherence tomography; IOP intraocular pressure; BRVO Branch retinal vein occlusion; CRVO central retinal vein occlusion; CRAO central retinal artery occlusion; BRAO branch retinal artery occlusion; RT retinal  tear; SB scleral buckle; PPV pars plana vitrectomy; VH Vitreous hemorrhage; PRP panretinal laser photocoagulation; IVK intravitreal kenalog; VMT vitreomacular traction; MH Macular hole;  NVD neovascularization of the disc; NVE neovascularization elsewhere; AREDS age related eye disease study; ARMD age related macular degeneration; POAG primary open angle glaucoma; EBMD epithelial/anterior basement membrane dystrophy; ACIOL anterior chamber intraocular lens; IOL intraocular lens; PCIOL posterior chamber intraocular lens; Phaco/IOL phacoemulsification with intraocular lens placement; PRK photorefractive keratectomy; LASIK laser assisted in situ keratomileusis; HTN hypertension; DM diabetes mellitus; COPD chronic obstructive pulmonary disease

## 2019-11-13 NOTE — Patient Instructions (Signed)
Patient instructed to report any new onset visual acuity decline particularly when reading or distortion in the vision

## 2020-03-24 ENCOUNTER — Telehealth: Payer: Self-pay | Admitting: Interventional Cardiology

## 2020-03-24 NOTE — Telephone Encounter (Signed)
° ° °  Pt c/o medication issue:  1. Name of Medication: Statins  2. How are you currently taking this medication (dosage and times per day)?   3. Are you having a reaction (difficulty breathing--STAT)?   4. What is your medication issue? Pt said he heard on the news that there's a better medications than statins and would like to know more about it

## 2020-03-24 NOTE — Telephone Encounter (Signed)
Since patient is recording no adverse effects with  Rosuvastatin, recommend he stay on it for now.  Recommend diet rich in soluble fiber, avoiding trans fats (found in processed foods) and refined carbohydrates, and to exercise reguralry to reduce small LDL. LDL is currently at goal, if he wants further control, next steps would be Nexlizet or Praluent/Repatha.

## 2020-03-24 NOTE — Telephone Encounter (Signed)
I don't think occasional cramps are from Crestor.  WOuld continue current meds for now and we can discuss Nexlizet or Praluent/repatha at next visit.   JV

## 2020-03-24 NOTE — Telephone Encounter (Signed)
I spoke with patient. He reports having small particle size LDL.  He read an article recently that said there was a medication that was more effective than statins at controlling particle size.  Not an injectable medication. He does not know the name of the medication.  He states it does not cause muscle cramping.  He is currently taking Rosuvastatin.  Does not have muscle pain but will occasionally have cramps in the back of his legs from knee to hip area.  Occurs while sitting. Not severe. He is asking if this medication may be appropriate for him.  He is seeing Dr Eldridge Dace in May and will discuss at this visit unless Dr Eldridge Dace feels change needs to be made prior to visit.

## 2020-05-19 NOTE — Progress Notes (Signed)
Cardiology Office Note   Date:  05/21/2020   ID:  Billy Adams, DOB 1945/06/24, MRN [id number]  PCP:  Mosetta Putt, MD    No chief complaint on file.  CAD  Wt Readings from Last 3 Encounters:  05/20/20 176 lb (79.8 kg)  05/29/19 176 lb (79.8 kg)  05/16/19 173 lb 6.4 oz (78.7 kg)       History of Present Illness: Billy Adams is a 75 y.o. male   who hasa h/oPVCs and CAD. Since high school, he can remember feeling palpitations with stressful events (public speaking, band solo, etc).Hebegan to notice irregularity in his pulse after exercise. He was seen by Dr. Duaine Dredge and had a holter monitor in 7/14 that showed frequent PVCs (7% of total complexes). He cut caffeine out almost entirely but continued to have PVCs with and after exercise, seeral years ago. This improved a few years later- see below.  He had 2 DES to the LAD in 2014. Other mild disease noted. Only sx he could think of were pain between the shoulder blades with exercise. PVCs were the biggest indicator at that time.   PVCs decreased after PCI.  He has had diagnosis ofaortic atherosclerosis and had a small AAA, diagnosed in 2012.5/18 ultrasound showed:Mildly dilated aorta(up to 2.8 cm), no intervention required. 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   Crestor increased to 10 mg daily in 2021.  He had frequent leg cramps in 2022.  Worse with sitting.  He talked to [facility] and he went from 10 mg to 5 mg.    He is concerned about long term side effects of statins.  A PT neighbor mentioned that his dorsiflexion is reduced.  Total cholesterol was 121 in 10/21.   He has more fatigue since before.  Exercise has decreased since COVID started.     Past Medical History:  Diagnosis Date  . AAA (abdominal aortic aneurysm) (HCC)    pt denies  . Allergy    seasonal  . Arthralgia of foot    bilateral  . BPH (benign prostatic hypertrophy) 1999  . CAD (coronary artery  disease), residual post PCI with 40% LM, 30% LCX 12/25/2012  . Cataract    surgery  . Coronary artery disease    DES TO LAD X2  . Degenerative disk disease    of cervical spine w/occasional radicular symptoms since 1985  . Detached retina 2003  . Detached retina    When right cataract was repaired, so Dr. Luciana Axe has recommened delaying the left side repair longer so there will be less chance of detachedment of the retina again.  Marland Kitchen GERD (gastroesophageal reflux disease)    occasionally-relief with tums or zantac  . Hypercholesterolemia   . Hyperlipidemia 1995  . Hypertension   . Impaired glucose tolerance 2011  . Lactose intolerance   . Left cataract   . Osteoarthritis of both feet   . Palpitations    Pt has noticed an increase of palps lately, especially after exercise  . Prostate nodule 2009   Seeing Dr. Laverle Patter. Had another biopsy last year wich was benign. PSAs have been stable.  Marland Kitchen PVC's (premature ventricular contractions)    Holter monitor 07/27/12 showed frequent PVCs, representing about 7% of his beats. Has improved since we reduced his caffeine.  . Reflux esophagitis 2004   Improved over last year  . S/P coronary artery stent placement -Prox LAD, DES 01/31/13  02/01/2013  . Sleeping difficulties 2002   2-3 nights a  wk. Takes Xanax & Melatonin PRN.  Marland Kitchen Varicose vein     Past Surgical History:  Procedure Laterality Date  . CARDIAC CATHETERIZATION  01/31/2013   DR MCLEAN  . CATARACT EXTRACTION W/ INTRAOCULAR LENS IMPLANT Right 2001  . CORONARY ANGIOPLASTY  01/31/2013   DES TO LAD X2  DR MACELHANY  . LEFT HEART CATHETERIZATION WITH CORONARY ANGIOGRAM Bilateral 01/31/2013   Procedure: LEFT HEART CATHETERIZATION WITH CORONARY ANGIOGRAM;  Surgeon: Laurey Morale, MD;  Location: [facility] CATH [facility];  Service: Cardiovascular;  Laterality: Bilateral;  . LESION REMOVAL  11/25/2011   Procedure: MINOR EXICISION OF LESION;  Surgeon: Wyn Forster., MD;  Location: [facility];   Service: Orthopedics;  Laterality: Right;  REMOVE THORN RIGHT INDEX FINGER  . PERCUTANEOUS CORONARY STENT INTERVENTION (PCI-S) Left 01/31/2013   Procedure: PERCUTANEOUS CORONARY STENT INTERVENTION (PCI-S);  Surgeon: Laurey Morale, MD;  Location: [facility] CATH LAB;  Service: Cardiovascular;  Laterality: Left;  des x2 prox lad, ffr  . RETINAL DETACHMENT SURGERY Right 2003   With Sling Procedure  . TONSILLECTOMY  1950     Current Outpatient Medications  Medication Sig Dispense Refill  . aspirin EC 81 MG tablet Take 1 tablet (81 mg total) by mouth daily. 90 tablet 3  . cholecalciferol (VITAMIN D) 1000 units tablet Take 1,000 Units by mouth daily.    . Coenzyme Q10 (CO Q 10) 100 MG CAPS Take 1 capsule by mouth daily.     Marland Kitchen GRAPE SEED CR PO Take 2 capsules by mouth daily. Muscadine grape seed (resveratol)    . MELATONIN PO Take 1 tablet by mouth as needed (for sleep).    . Multiple Vitamins-Minerals (MULTIVITAMIN WITH MINERALS) tablet Take 1 tablet by mouth daily.    . nitroGLYCERIN (NITROSTAT) 0.4 MG SL tablet Place 1 tablet (0.4 mg total) under the tongue every 5 (five) minutes as needed for chest pain. 100 tablet 3  . Omega-3 Fatty Acids (FISH OIL PO) Take 1 capsule by mouth daily.    . rosuvastatin (CRESTOR) 10 MG tablet Take 5 mg by mouth daily.    . vitamin B-12 (CYANOCOBALAMIN) 1000 MCG tablet Take 1,000 mcg by mouth daily.     No current facility-administered medications for this visit.    Allergies:   Other    Social History:  The patient  reports that he has never smoked. He has never used smokeless tobacco. He reports current alcohol use. He reports that he does not use drugs.   Family History:  The patient's family history includes Anxiety disorder in his sister; Atrial fibrillation (age of onset: 67) in his mother; Bipolar disorder in his sister; CVA in his mother; Coronary artery disease in his father; Heart attack in his father; Heart disease in his father; Seasonal affective  disorder in his sister.    ROS:  Please see the history of present illness.   Otherwise, review of systems are positive for feels some leg muscle weakness.   All other systems are reviewed and negative.    PHYSICAL EXAM: VS:  BP 138/72   Pulse 62   Ht 6\' 2"  (1.88 m)   Wt 176 lb (79.8 kg)   SpO2 97%   BMI 22.60 kg/m  , BMI Body mass index is 22.6 kg/m. GEN: Well nourished, well developed, in no acute distress  HEENT: normal  Neck: no JVD, carotid bruits, or masses Cardiac: RRR; no murmurs, rubs, or gallops,no edema  Respiratory:  clear to auscultation bilaterally,  normal work of breathing GI: soft, nontender, nondistended, + BS MS: no deformity or atrophy  Skin: warm and dry, no rash Neuro:  Strength and sensation are intact Psych: euthymic mood, full affect   EKG:   The ekg ordered today demonstrates NSR, no ST changes   Recent Labs: No results found for requested labs within last 8760 hours.   Lipid Panel    Component Value Date/Time   CHOL 104 04/26/2014 0850   TRIG 50.0 04/26/2014 0850   HDL 37.90 (L) 04/26/2014 0850   CHOLHDL 3 04/26/2014 0850   VLDL 10.0 04/26/2014 0850   LDLCALC 56 04/26/2014 0850     Other studies Reviewed: Additional studies/ records that were reviewed today with results demonstrating: labs reviewed.   ASSESSMENT AND PLAN:  1. CAD: No angina.  Continue aggressive secondary prevention.  He follows a vegan diet.  Try to increase duration and intensity of exercise to build stamina. If he has any anginal sx, he will let us know.  2. PVCs: No sx.  3. Hyperlipidemia: LDL 57 in 2022.  Continue lower dose statin.  He felt that the 10 mg of Crestor was giving leg cramps. Labs are still well controlled.  4. Aortic atherosclerosis: Vegan diet and statin.      Current medicines are reviewed at length with the patient today.  The patient concerns regarding his medicines were addressed.  The following changes have been made:  No change  Labs/  tests ordered today include:   Orders Placed This Encounter  Procedures  . EKG 12-Lead    Recommend 150 minutes/week of aerobic exercise Low fat, low carb, high fiber diet recommended  Disposition:   FU in 1 year   Signed, Lance Muss, MD  05/21/2020 8:27 AM    [facility] [address] [place], [place], [place]  [address] Phone: [phone]; Fax: [phone]

## 2020-05-20 ENCOUNTER — Ambulatory Visit: Payer: Medicare PPO | Admitting: Interventional Cardiology

## 2020-05-20 ENCOUNTER — Encounter: Payer: Self-pay | Admitting: Interventional Cardiology

## 2020-05-20 ENCOUNTER — Other Ambulatory Visit: Payer: Self-pay

## 2020-05-20 VITALS — BP 138/72 | HR 62 | Ht 74.0 in | Wt 176.0 lb

## 2020-05-20 DIAGNOSIS — I10 Essential (primary) hypertension: Secondary | ICD-10-CM

## 2020-05-20 DIAGNOSIS — I493 Ventricular premature depolarization: Secondary | ICD-10-CM | POA: Diagnosis not present

## 2020-05-20 DIAGNOSIS — I700 Atherosclerosis of aorta: Secondary | ICD-10-CM

## 2020-05-20 DIAGNOSIS — E782 Mixed hyperlipidemia: Secondary | ICD-10-CM

## 2020-05-20 DIAGNOSIS — I25118 Atherosclerotic heart disease of native coronary artery with other forms of angina pectoris: Secondary | ICD-10-CM | POA: Diagnosis not present

## 2020-05-20 NOTE — Patient Instructions (Signed)

## 2020-07-02 ENCOUNTER — Ambulatory Visit: Payer: Medicare PPO | Admitting: Neurology

## 2020-07-02 ENCOUNTER — Encounter: Payer: Self-pay | Admitting: Neurology

## 2020-07-02 VITALS — BP 138/77 | HR 61 | Ht 74.0 in | Wt 172.0 lb

## 2020-07-02 DIAGNOSIS — R0683 Snoring: Secondary | ICD-10-CM | POA: Diagnosis not present

## 2020-07-02 DIAGNOSIS — R351 Nocturia: Secondary | ICD-10-CM | POA: Diagnosis not present

## 2020-07-02 DIAGNOSIS — T733XXA Exhaustion due to excessive exertion, initial encounter: Secondary | ICD-10-CM | POA: Insufficient documentation

## 2020-07-02 DIAGNOSIS — T733XXS Exhaustion due to excessive exertion, sequela: Secondary | ICD-10-CM

## 2020-07-02 DIAGNOSIS — G478 Other sleep disorders: Secondary | ICD-10-CM | POA: Diagnosis not present

## 2020-07-02 DIAGNOSIS — I2510 Atherosclerotic heart disease of native coronary artery without angina pectoris: Secondary | ICD-10-CM | POA: Diagnosis not present

## 2020-07-02 NOTE — Patient Instructions (Signed)
Screening for Sleep Apnea Sleep apnea is a condition in which breathing pauses or becomes shallow during sleep. Sleep apnea screening is a test to determine if you are at risk for sleep apnea. The test includes a series of questions. It will only takes a few minutes. Your health care provider may ask you to have this test in preparation for surgery or as part of a physical exam. What are the symptoms of sleep apnea? Common symptoms of sleep apnea include: Snoring. Waking up often at night. Daytime sleepiness. Pauses in breathing. Choking or gasping during sleep. Irritability. Forgetfulness. Trouble thinking clearly. Depression. Personality changes. Most people with sleep apnea do not know that they have it. What are the advantages of sleep apnea screening? Getting screened for sleep apnea can help: Ensure your safety. It is important for your health care providers to know whether or not you have sleep apnea, especially if you are having surgery or have other long-term (chronic) health conditions. Improve your health and allow you to get a better night's rest. Restful sleep can help you: Have more energy. Lose weight. Improve high blood pressure. Improve diabetes management. Prevent stroke. Prevent car accidents. What happens during the screening? Screening usually includes being asked a list of questions about your sleep quality. Some questions you may be asked include: Do you snore? Is your sleep restless? Do you have daytime sleepiness? Has a partner or spouse told you that you stop breathing during sleep? Have you had trouble concentrating or memory loss? What is your age? What is your neck circumference? To measure your neck, keep your back straight and gently wrap the tape measure around your neck. Put the tape measure at the middle of your neck, between your chin and collarbone. What is your sex assigned at birth? Do you have or are you being treated for high blood  pressure? If your screening test is positive, you are at risk for the condition. Further testing may be needed to confirm a diagnosis of sleep apnea. Where to find more information You can find screening tools online or at your health care clinic. For more information about sleep apnea screening and healthy sleep, visit these websites: Centers for Disease Control and Prevention: www.cdc.gov American Sleep Apnea Association: www.sleepapnea.org Contact a health care provider if: You think that you may have sleep apnea. Summary Sleep apnea screening can help determine if you are at risk for sleep apnea. It is important for your health care providers to know whether or not you have sleep apnea, especially if you are having surgery or have other chronic health conditions. You may be asked to take a screening test for sleep apnea in preparation for surgery or as part of a physical exam. This information is not intended to replace advice given to you by your health care provider. Make sure you discuss any questions you have with your health care provider. Document Revised: 12/14/2019 Document Reviewed: 12/14/2019 Elsevier Patient Education  2022 [employer] Inc.  

## 2020-07-02 NOTE — Progress Notes (Signed)
SLEEP MEDICINE CLINIC    Provider:  Melvyn Novasarmen  Broc Caspers, MD  Primary Care Physician:  Mosetta PuttBlomgren, Peter, MD [address] [place] [id number]     Referring Provider: Mosetta PuttBlomgren, Peter, Md [address] [place],  [place] [id number]          Chief Complaint according to patient   Patient presents with:     New Patient (Initial Visit)           HISTORY OF PRESENT ILLNESS:  Billy Adams is a 75 y.o.  Caucasian male patient seen here upon a referral on 07/02/2020 from Dr. Duaine DredgeBlomgren for a sleep consultation.  Chief concern according to patient :  He is ruling out if apnea is a concern. He admits to waking himself up snoring at night. He avg's 7/8 hrs of sleep and will wake up every 2-3 hrs to void but typically able to go back to sleep. He admits to not feeling as rested upon waking up.    I have the pleasure of seeing Billy IvanoffRoderick Cail today, a right -handed Caucasian male with a possible sleep disorder. He  has a past medical history of AAA (abdominal aortic aneurysm) (HCC), Allergy, Arthralgia of foot, BPH (benign prostatic hypertrophy) (1999), CAD (coronary artery disease), residual post PCI with 40% LM, 30% LCX (12/25/2012), Cataract, Coronary artery disease, Degenerative disk disease, Detached retina (2003), Detached retina, GERD (gastroesophageal reflux disease), Hypercholesterolemia, Hyperlipidemia (1995), Hypertension, Impaired glucose tolerance (2011), Lactose intolerance, Left cataract, Osteoarthritis of both feet, Palpitations, Prostate nodule (2009), PVC's (premature ventricular contractions), Reflux esophagitis (2004), S/P coronary artery stent placement -Prox LAD, DES 01/31/13  (02/01/2013), Sleeping difficulties (2002), and Varicose vein.      Sleep relevant medical history: Nocturia 3-4 times (!) Snoring has gotten louder,  sleep walking in childhood isolated, dream enactment, kicking- rare-  tonsillectomy , no other ENT procedures.    Family medical /sleep  history: No  other family member on CPAP with OSA.  Social history:  Patient is sell travelled and  lived in [place]. 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 [place] ancestry - he suspects. with 2 adult daughters and grandchildren.  Pets are not present. Tobacco use- only passive exposure.   ETOH use: once a month 1 glass of wine,  Caffeine intake in form of Coffee( 1 cup a day) ,Tea ( green tea 2 cups a day ) or energy drinks. Regular exercise in form of walking.         Sleep habits are as follows: The patient's dinner time is between 7-8 PM. The patient goes to bed at 11 PM and continues to sleep for intervals of 2 hours interrupted by frequent bathroom breaks. The preferred sleep position is supine and may change to prone, with the support of  1 memory foam pillow ,  another between knees. .  Dreams are reportedly frequent/vivid.  7 AM is the usual rise time. The patient wakes up spontaneously/.  He reports not feeling refreshed or restored in AM, with symptoms such as dry mouth( when sleeping supine ) Naps are taken infrequently, once a week, duration of 15 minutes, more can affect nocturnal sleep.    Review of Systems: Out of a complete 14 system review, the patient complains of only the following symptoms, and all other reviewed systems are negative.:    Varicosa vein disease. Knee pain.  Fatigue, sleepiness , snoring,  fragmented sleep, Insomnia    How likely are you to doze in the following situations: 0 = not likely, 1 = slight chance, 2 = moderate chance, 3 = high chance   Sitting and Reading? Watching Television?1 Sitting inactive in a public place (theater or meeting)? As a passenger in a car for an hour without a break? Lying down in the afternoon when circumstances permit?2 Sitting and talking to someone? Sitting quietly after lunch without alcohol?2 In a car, while stopped for a few  minutes in traffic?   Total = 5/ 24 points   FSS endorsed at 31/ 63 points.   Social History   Socioeconomic History   Marital status: Married    Spouse name: Not on file   Number of children: Not on file   Years of education: Not on file   Highest education level: Not on file  Occupational History   Not on file  Tobacco Use   Smoking status: Never   Smokeless tobacco: Never  Substance and Sexual Activity   Alcohol use: Yes    Alcohol/week: 0.0 standard drinks    Comment: occasionally, 2-3 a month   Drug use: No   Sexual activity: Not on file  Other Topics Concern   Not on file  Social History Narrative   Not on file   Social Determinants of Health   Financial Resource Strain: Not on file  Food Insecurity: Not on file  Transportation Needs: Not on file  Physical Activity: Not on file  Stress: Not on file  Social Connections: Not on file    Family History  Problem Relation Age of Onset   Coronary artery disease Father    Heart attack Father    Heart disease Father        rheumatic heart disease   CVA Mother    Atrial fibrillation Mother 56   Bipolar disorder Sister    Seasonal affective disorder Sister    Anxiety disorder Sister    Colon cancer Neg Hx     Past Medical History:  Diagnosis Date   AAA (abdominal aortic aneurysm) (HCC)    pt denies   Allergy    seasonal   Arthralgia of foot    bilateral   BPH (benign prostatic hypertrophy) 1999   CAD (coronary artery disease), residual post PCI with 40% LM, 30% LCX 12/25/2012   Cataract    surgery   Coronary artery disease    DES TO LAD X2   Degenerative disk disease    of cervical spine w/occasional radicular symptoms since 1985   Detached retina 2003   Detached retina    When right cataract was repaired, so Dr. Luciana Axe has recommened delaying the left side repair longer so there will be less chance of detachedment of the retina again.   GERD (gastroesophageal reflux disease)    occasionally-relief  with tums or zantac   Hypercholesterolemia    Hyperlipidemia 1995   Hypertension    Impaired glucose tolerance 2011   Lactose intolerance    Left cataract    Osteoarthritis of both feet    Palpitations    Pt has noticed an increase of palps lately, especially after exercise   Prostate nodule 2009   Seeing Dr. Laverle Patter. Had another biopsy last year wich was benign. PSAs have been stable.   PVC's (premature ventricular contractions)    Holter monitor 07/27/12 showed frequent PVCs, representing about 7% of his beats. Has improved since we reduced his caffeine.   Reflux  esophagitis 2004   Improved over last year   S/P coronary artery stent placement -Prox LAD, DES 01/31/13  02/01/2013   Sleeping difficulties 2002   2-3 nights a wk. Takes Xanax & Melatonin PRN.   Varicose vein     Past Surgical History:  Procedure Laterality Date   CARDIAC CATHETERIZATION  01/31/2013   DR [facility]   CATARACT EXTRACTION W/ INTRAOCULAR LENS IMPLANT Right 2001   CORONARY ANGIOPLASTY  01/31/2013   DES TO LAD X2  DR [facility]   LEFT HEART CATHETERIZATION WITH CORONARY ANGIOGRAM Bilateral 01/31/2013   Procedure: LEFT HEART CATHETERIZATION WITH CORONARY ANGIOGRAM;  Surgeon: Laurey Morale, MD;  Location: [facility] CATH LAB;  Service: Cardiovascular;  Laterality: Bilateral;   LESION REMOVAL  11/25/2011   Procedure: MINOR EXICISION OF LESION;  Surgeon: Wyn Forster., MD;  Location: [facility];  Service: Orthopedics;  Laterality: Right;  REMOVE THORN RIGHT INDEX FINGER   PERCUTANEOUS CORONARY STENT INTERVENTION (PCI-S) Left 01/31/2013   Procedure: PERCUTANEOUS CORONARY STENT INTERVENTION (PCI-S);  Surgeon: Laurey Morale, MD;  Location: [facility] CATH LAB;  Service: Cardiovascular;  Laterality: Left;  des x2 prox lad, ffr   RETINAL DETACHMENT SURGERY Right 2003   With Sling Procedure   TONSILLECTOMY  1950     Current Outpatient Medications on File Prior to Visit  Medication Sig Dispense Refill   aspirin EC  81 MG tablet Take 1 tablet (81 mg total) by mouth daily. 90 tablet 3   cholecalciferol (VITAMIN D) 1000 units tablet Take 1,000 Units by mouth daily.     Coenzyme Q10 (CO Q 10 PO) Take 200 mg by mouth daily.     GRAPE SEED CR PO Take 1 capsule by mouth daily. [place] grape seed (resveratol)     MELATONIN PO Take 3 mg by mouth as needed (for sleep).     Multiple Vitamins-Minerals (MULTIVITAMIN WITH MINERALS) tablet Take 1 tablet by mouth daily.     Omega-3 Fatty Acids (FISH OIL PO) Take 1 capsule by mouth daily.     rosuvastatin (CRESTOR) 10 MG tablet Take 5 mg by mouth daily.     vitamin B-12 (CYANOCOBALAMIN) 1000 MCG tablet Take 1,000 mcg by mouth daily.     nitroGLYCERIN (NITROSTAT) 0.4 MG SL tablet Place 1 tablet (0.4 mg total) under the tongue every 5 (five) minutes as needed for chest pain. (Patient not taking: Reported on 07/02/2020) 100 tablet 3   No current facility-administered medications on file prior to visit.    Allergies  Allergen Reactions   Other     RYE> diarrhea, cats, tobacco smoke, alternaria    Physical exam:  Today's Vitals   07/02/20 0850  BP: 138/77  Pulse: 61  Weight: 172 lb (78 kg)  Height: 6\' 2"  (1.88 m)   Body mass index is 22.08 kg/m.   Wt Readings from Last 3 Encounters:  07/02/20 172 lb (78 kg)  05/20/20 176 lb (79.8 kg)  05/29/19 176 lb (79.8 kg)     Ht Readings from Last 3 Encounters:  07/02/20 6\' 2"  (1.88 m)  05/20/20 6\' 2"  (1.88 m)  05/29/19 6\' 2"  (1.88 m)      General: The patient is awake, alert and appears not in acute distress. The patient is well groomed. He is very tal and slender built.  Head: Normocephalic, atraumatic.  Neck is supple. Mallampati 1,  neck circumference:14 inches . Nasal airflow  patent.  Retrognathia is mildly seen.  Dental status: biological  Cardiovascular:  Regular rate and cardiac rhythm by pulse,  without distended neck veins. Respiratory: Lungs are clear to auscultation.  Skin:  Without evidence of  ankle edema, or rash. Trunk: The patient's posture is erect.   Neurologic exam : The patient is awake and alert, oriented to place and time.   Memory subjective described as intact.  Attention span & concentration ability appears normal.  Speech is fluent,  without  dysarthria, dysphonia or aphasia.  Mood and affect are appropriate.   Cranial nerves: no loss of smell or taste reported  Pupils are equal and briskly reactive to light. Funduscopic exam deferred. .  Extraocular movements in vertical and horizontal planes were intact and without nystagmus. No Diplopia. Visual fields by finger perimetry are intact. Hearing was intact to soft voice and finger rubbing.    Facial sensation intact to fine touch.  Facial motor strength is symmetric and tongue and uvula move midline.  Neck ROM : rotation, tilt and flexion extension were normal for age and shoulder shrug was symmetrical.    Motor exam:  Symmetric bulk, tone and ROM.  trace DTR only. Trouble to climb stairs- tired easily. Dorsiflexion seems weaker, not affecting proximal muscles.  Muscle mass loss , especially at the thigh, quadriceps.   Normal tone without cog-wheeling, symmetric grip strength .   Sensory:  Fine touch, pinprick and vibration were tested  and  normal.  Proprioception tested in the upper extremities was normal.   Coordination: Rapid alternating movements in the fingers/hands were of normal speed.  The Finger-to-nose maneuver was intact without evidence of ataxia, dysmetria or tremor.   Gait and station: Patient could rise unassisted from a seated position, walked without assistive device.  Stance is of normal width/ base and the patient turned with 3 steps.  Toe and heel walk were deferred.  Deep tendon reflexes: in the  upper and lower extremities are attenuated - symmetric and intact.  Babinski response was deferred .      After spending a total time of  45  minutes face to face and additional time for  physical and neurologic examination, review of laboratory studies,  personal review of imaging studies, reports and results of other testing and review of referral information / records as far as provided in visit, I have established the following assessments:  1) patient is presenting with less restorative sleep, less exercise tolerance and feels easier fatigued. Has CAD.  2) his PT noted dorsiflexion weakness- ? Neuropathy? . 3) snoring   My Plan is to proceed with:  1) HST should suffice for this patient.  2) neuropathy can be age related,  but I like to check on possible spinal stenosis, but he has no back pain.  3) GNA neuropathy panel Consider d/c statins in a patient with vegan diet..   I would like to thank Mosetta Putt, MD and Mosetta Putt, Md [address] [place],  [place] [address] for allowing me to meet with and to take care of this pleasant patient.   In short, Semisi Biela is presenting with ,fatigue , rather than sleepiness-  a symptom that can be attributed to OSA, and sleep interruption by nocturia..   I plan to follow up either personally or through our NP within 2-4 month.    Electronically signed by: Melvyn Novas, MD 07/02/2020 9:13 AM  [facility] Board certified by The [employer] of Sleep Medicine and Diplomate of the [employer] of Sleep Medicine. Board certified  In Neurology through the ABPN, Fellow of the [employer] of Neurology. Medical Director of [employer].

## 2020-07-03 ENCOUNTER — Encounter: Payer: Self-pay | Admitting: Neurology

## 2020-07-03 NOTE — Progress Notes (Signed)
Normal CBC and diff , TSH and CMET - only elevated lab result was HbA1c.

## 2020-07-07 ENCOUNTER — Telehealth: Payer: Self-pay

## 2020-07-08 ENCOUNTER — Telehealth: Payer: Self-pay | Admitting: Neurology

## 2020-07-08 LAB — COMPREHENSIVE METABOLIC PANEL
ALT: 23 IU/L (ref 0–44)
AST: 34 IU/L (ref 0–40)
Albumin/Globulin Ratio: 2.2 (ref 1.2–2.2)
Albumin: 4.7 g/dL (ref 3.7–4.7)
Alkaline Phosphatase: 52 IU/L (ref 44–121)
BUN/Creatinine Ratio: 17 (ref 10–24)
BUN: 14 mg/dL (ref 8–27)
Bilirubin Total: 0.4 mg/dL (ref 0.0–1.2)
CO2: 20 mmol/L (ref 20–29)
Calcium: 9.3 mg/dL (ref 8.6–10.2)
Chloride: 102 mmol/L (ref 96–106)
Creatinine, Ser: 0.81 mg/dL (ref 0.76–1.27)
Globulin, Total: 2.1 g/dL (ref 1.5–4.5)
Glucose: 91 mg/dL (ref 65–99)
Potassium: 4.8 mmol/L (ref 3.5–5.2)
Sodium: 139 mmol/L (ref 134–144)
Total Protein: 6.8 g/dL (ref 6.0–8.5)
eGFR: 91 mL/min/{1.73_m2} (ref >59)

## 2020-07-08 LAB — CBC WITH DIFFERENTIAL/PLATELET
Basophils Absolute: 0 10*3/uL (ref 0.0–0.2)
Basos: 1 %
EOS (ABSOLUTE): 0.2 10*3/uL (ref 0.0–0.4)
Eos: 5 %
Hematocrit: 39.2 % (ref 37.5–51.0)
Hemoglobin: 13 g/dL (ref 13.0–17.7)
Immature Grans (Abs): 0 10*3/uL (ref 0.0–0.1)
Immature Granulocytes: 0 %
Lymphocytes Absolute: 1 10*3/uL (ref 0.7–3.1)
Lymphs: 22 %
MCH: 31.3 pg (ref 26.6–33.0)
MCHC: 33.2 g/dL (ref 31.5–35.7)
MCV: 94 fL (ref 79–97)
Monocytes Absolute: 0.5 10*3/uL (ref 0.1–0.9)
Monocytes: 12 %
Neutrophils Absolute: 2.6 10*3/uL (ref 1.4–7.0)
Neutrophils: 60 %
Platelets: 179 10*3/uL (ref 150–450)
RBC: 4.16 x10E6/uL (ref 4.14–5.80)
RDW: 11.9 % (ref 11.6–15.4)
WBC: 4.3 10*3/uL (ref 3.4–10.8)

## 2020-07-08 LAB — SJOGREN'S SYNDROME ANTIBODS(SSA + SSB)
ENA SSA (RO) Ab: <0.2 AI (ref 0.0–0.9)
ENA SSB (LA) Ab: <0.2 AI (ref 0.0–0.9)

## 2020-07-08 LAB — C-REACTIVE PROTEIN: CRP: <1 mg/L (ref 0–10)

## 2020-07-08 LAB — TSH: TSH: 2.5 u[IU]/mL (ref 0.450–4.500)

## 2020-07-08 LAB — MULTIPLE MYELOMA PANEL, SERUM
Albumin SerPl Elph-Mcnc: 4.2 g/dL (ref 2.9–4.4)
Albumin/Glob SerPl: 1.7 (ref 0.7–1.7)
Alpha 1: 0.2 g/dL (ref 0.0–0.4)
Alpha2 Glob SerPl Elph-Mcnc: 0.6 g/dL (ref 0.4–1.0)
B-Globulin SerPl Elph-Mcnc: 1 g/dL (ref 0.7–1.3)
Gamma Glob SerPl Elph-Mcnc: 0.8 g/dL (ref 0.4–1.8)
Globulin, Total: 2.6 g/dL (ref 2.2–3.9)
IgA/Immunoglobulin A, Serum: 346 mg/dL (ref 61–437)
IgG (Immunoglobin G), Serum: 915 mg/dL (ref 603–1613)
IgM (Immunoglobulin M), Srm: 72 mg/dL (ref 15–143)

## 2020-07-08 LAB — SEDIMENTATION RATE: Sed Rate: 2 mm/hr (ref 0–30)

## 2020-07-08 LAB — HEMOGLOBIN A1C
Est. average glucose Bld gHb Est-mCnc: 117 mg/dL
Hgb A1c MFr Bld: 5.7 % — ABNORMAL HIGH (ref 4.8–5.6)

## 2020-07-08 LAB — VITAMIN B12: Vitamin B-12: 1170 pg/mL (ref 232–1245)

## 2020-07-08 NOTE — Telephone Encounter (Signed)
Contacted pt to inform him Normal CBC and diff , TSH and CMET - only elevated lab result was HbA1c. He would like a copy of this when he comes for his HST in July, had no other questions at the time

## 2020-07-08 NOTE — Progress Notes (Signed)
See telephone note from 07/08/20

## 2020-07-08 NOTE — Telephone Encounter (Signed)
error 

## 2020-07-08 NOTE — Telephone Encounter (Signed)
Pt returning phone call that he received on yesterday.

## 2020-07-28 ENCOUNTER — Ambulatory Visit (INDEPENDENT_AMBULATORY_CARE_PROVIDER_SITE_OTHER): Payer: Medicare PPO | Admitting: Neurology

## 2020-07-28 ENCOUNTER — Other Ambulatory Visit: Payer: Self-pay | Admitting: Neurology

## 2020-07-28 DIAGNOSIS — T733XXS Exhaustion due to excessive exertion, sequela: Secondary | ICD-10-CM

## 2020-07-28 DIAGNOSIS — R0683 Snoring: Secondary | ICD-10-CM | POA: Diagnosis not present

## 2020-07-28 DIAGNOSIS — G478 Other sleep disorders: Secondary | ICD-10-CM | POA: Diagnosis not present

## 2020-07-28 DIAGNOSIS — R351 Nocturia: Secondary | ICD-10-CM

## 2020-07-28 DIAGNOSIS — G4733 Obstructive sleep apnea (adult) (pediatric): Secondary | ICD-10-CM

## 2020-07-28 DIAGNOSIS — I2510 Atherosclerotic heart disease of native coronary artery without angina pectoris: Secondary | ICD-10-CM

## 2020-08-07 NOTE — Progress Notes (Signed)
      [facility] SLEEP TEST REPORT ( by Watch PAT)   STUDY DATE:  07-30-2020 DOB:  1945-07-17 MRN: [id number]   ORDERING CLINICIAN: Jacklynn Ganong, MD  REFERRING CLINICIAN: Dr. Mosetta Putt   CLINICAL INFORMATION/HISTORY: Non restorative sleep, frequent Nocturia , snoring, has CAD and stent placement, AAA.      Epworth sleepiness score: 05/24.   BMI:22.08 kg/m   Neck Circumference: 14 inches     Sleep Summary:   Total Recording Time (hours, min): 8 h,26 m       Total Sleep Time (hours, min):    6 h, 56 m.             Percent REM (%):   7.9%                                     Respiratory Indices:   Calculated pAHI (per hour):  3.2/h ; RDI was 5.4                          REM pAHI: 7.3/h                             ;REM RDI was 14.6 /h                   NREM pAHI:  2.8                            Supine AHI:   4.3/h                                                Oxygen Saturation Statistics:   Oxygen Saturation (%) Mean:   95%           Minimum oxygen saturation was 76 % and maximum 99%:                                       O2 Saturation (minutes) <89%: 0.1 min          Pulse Rate Statistics:   Pulse Mean (bpm):   56             Pulse Range:     48-80 bpm           IMPRESSION:  This HST did not confirm the presence of OSA at a degree that warrants intervention. Snoring  related arousals(RDI) were present, ans supine position dependent.    RECOMMENDATION: I would prefer a positional therapy ( avoiding supine sleep by tennis ball or phillips device) as first treatment, my second choice would be a dental device ( but this is usually not covered by insurance unless diagnosed with OSA).   The sleep pattern was not very fragmented and may improve under melatonin or trazodone.     INTERPRETING PHYSICIAN:   Melvyn Novas, MD   Medical Director of [facility].

## 2020-08-13 ENCOUNTER — Telehealth: Payer: Self-pay | Admitting: *Deleted

## 2020-08-13 NOTE — Procedures (Signed)
[facility] SLEEP TEST REPORT ( by Watch PAT)   STUDY DATE:  07-30-2020 DOB:  1945-07-17 MRN: [id number]   ORDERING CLINICIAN: Jacklynn Ganong, MD  REFERRING CLINICIAN: Dr. Mosetta Putt   CLINICAL INFORMATION/HISTORY: Non restorative sleep, frequent Nocturia , snoring, has CAD and stent placement, AAA.      Epworth sleepiness score: 05/24.   BMI:22.08 kg/m   Neck Circumference: 14 inches     Sleep Summary:   Total Recording Time (hours, min): 8 h,26 m       Total Sleep Time (hours, min):    6 h, 56 m.             Percent REM (%):   7.9%                                     Respiratory Indices:   Calculated pAHI (per hour):  3.2/h ; RDI was 5.4                          REM pAHI: 7.3/h                             ;REM RDI was 14.6 /h                   NREM pAHI:  2.8                            Supine AHI:   4.3/h                                                Oxygen Saturation Statistics:   Oxygen Saturation (%) Mean:   95%           Minimum oxygen saturation was 76 % and maximum 99%:                                       O2 Saturation (minutes) <89%: 0.1 min          Pulse Rate Statistics:   Pulse Mean (bpm):   56             Pulse Range:     48-80 bpm           IMPRESSION:  This HST did not confirm the presence of OSA at a degree that warrants intervention. Snoring  related arousals(RDI) were present, ans supine position dependent.    RECOMMENDATION: I would prefer a positional therapy ( avoiding supine sleep by tennis ball or phillips device) as first treatment, my second choice would be a dental device ( but this is usually not covered by insurance unless diagnosed with OSA).   The sleep pattern was not very fragmented and may improve under melatonin or trazodone.     INTERPRETING PHYSICIAN:   Melvyn Novas, MD   Medical Director of [facility].

## 2020-08-13 NOTE — Telephone Encounter (Signed)
-----   Message from Melvyn Novas, MD sent at 08/13/2020  9:09 AM EDT ----- IMPRESSION:  This HST did not confirm the presence of OSA at a degree that warrants intervention. Snoring  related arousals(RDI) were present, ans supine position dependent.  RECOMMENDATION: I would prefer a positional therapy ( avoiding supine sleep by tennis ball or phillips device) as first treatment, my second choice would be a dental device ( but this is usually not covered by insurance unless diagnosed with OSA).   The sleep pattern was not very fragmented and may improve under melatonin or trazodone.

## 2020-08-13 NOTE — Telephone Encounter (Signed)
Called and spoke w/ wife. Pt unavailable at time of call. He will call back. Advised we are open until 5pm

## 2020-08-13 NOTE — Progress Notes (Signed)
IMPRESSION:  This HST did not confirm the presence of OSA at a degree that warrants intervention. Snoring  related arousals(RDI) were present, ans supine position dependent.  RECOMMENDATION: I would prefer a positional therapy ( avoiding supine sleep by tennis ball or phillips device) as first treatment, my second choice would be a dental device ( but this is usually not covered by insurance unless diagnosed with OSA).   The sleep pattern was not very fragmented and may improve under melatonin or trazodone.

## 2020-08-13 NOTE — Telephone Encounter (Signed)
Pt called back and I was able to review the sleep study in detail with him. Advised there was no concern for OSA and that the study was overall normal. Pt states that he wakes up frequently to void in the night. Informed the patient that snoring was louder when he was on his back and he can avoid sleeping on his back to help with decreasing. Pt verbalized understanding. Pt had no questions at this time but was encouraged to call back if questions arise.

## 2021-04-01 ENCOUNTER — Other Ambulatory Visit: Payer: Self-pay | Admitting: Family Medicine

## 2021-04-01 ENCOUNTER — Ambulatory Visit
Admission: RE | Admit: 2021-04-01 | Discharge: 2021-04-01 | Disposition: A | Discharge status: Home / Self Care | Payer: Medicare PPO | Source: Ambulatory Visit | Attending: Family Medicine | Admitting: Family Medicine

## 2021-04-01 ENCOUNTER — Other Ambulatory Visit: Payer: Self-pay

## 2021-04-01 DIAGNOSIS — R0609 Other forms of dyspnea: Secondary | ICD-10-CM

## 2021-04-01 DIAGNOSIS — R29898 Other symptoms and signs involving the musculoskeletal system: Secondary | ICD-10-CM

## 2021-04-16 ENCOUNTER — Other Ambulatory Visit (HOSPITAL_COMMUNITY): Payer: Self-pay | Admitting: Family Medicine

## 2021-04-16 DIAGNOSIS — I739 Peripheral vascular disease, unspecified: Secondary | ICD-10-CM

## 2021-04-17 ENCOUNTER — Ambulatory Visit (HOSPITAL_COMMUNITY)
Admission: RE | Admit: 2021-04-17 | Discharge: 2021-04-17 | Disposition: A | Discharge status: Home / Self Care | Payer: Medicare PPO | Source: Ambulatory Visit | Attending: Family Medicine | Admitting: Family Medicine

## 2021-04-17 DIAGNOSIS — I739 Peripheral vascular disease, unspecified: Secondary | ICD-10-CM

## 2021-05-13 NOTE — Progress Notes (Addendum)
? ?Cardiology Office Note   ? ?Date:  05/20/2021  ? ?ID:  Billy Adams, DOB 09-Aug-1945, MRN [id number] ? ? ?PCP:  Mosetta PuttBlomgren, Peter, MD ?  ?[facility]  ?Cardiologist:  Lance MussJayadeep Varanasi, MD   ?Advanced Practice Provider:  No care team member to display ?Electrophysiologist:  None  ? ?[id number]}  ? ?Chief Complaint  ?Patient presents with  ? Follow-up  ? ? ?History of Present Illness:  ?Billy Adams is a 76 y.o. male with history of CAD status post DES x2 to the LAD 2014, palpitations with PVCs especially with stressful events like public speaking and after exercise, PVCs did decrease after PCI.  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 ? ?Patient last saw Dr. Eldridge DaceVaranasi 05/20/2020 and was doing well without angina.  Increase in exercise recommended.  ? ?Patient comes in for yearly f/u. LDL particle number has gone down 200 points on metformin. Complains of weakness in upper legs. DOE after 15 min of walking in neighborhood and has to stop and rest. Worse when it's hot and humid. Has had many tests ordered by PCP-diagnosed with COPD(never smoked but father heavy smoker in the house) and spondylosis, normal sleep study. Vasc US with ABI 04/17/21 normal no left lower ext art disease. Seeing ortho. Walking 15-20 min a day and works in the garden. He's vegan.  ? ? ? ?Past Medical History:  ?Diagnosis Date  ? AAA (abdominal aortic aneurysm) (HCC)   ? pt denies  ? Allergy   ? seasonal  ? Arthralgia of foot   ? bilateral  ? BPH (benign prostatic hypertrophy) 1999  ? CAD (coronary artery disease), residual post PCI with 40% LM, 30% LCX 12/25/2012  ? Cataract   ? surgery  ? Coronary artery disease   ? DES TO LAD X2  ? Degenerative disk disease   ? of cervical spine w/occasional radicular symptoms since 1985  ? Detached retina 2003  ? Detached retina   ? When right cataract was repaired, so Dr. Luciana Axeankin has recommened delaying the  left side repair longer so there will be less chance of detachedment of the retina again.  ? GERD (gastroesophageal reflux disease)   ? occasionally-relief with tums or zantac  ? Hypercholesterolemia   ? Hyperlipidemia 1995  ? Hypertension   ? Impaired glucose tolerance 2011  ? Lactose intolerance   ? Left cataract   ? Osteoarthritis of both feet   ? Palpitations   ? Pt has noticed an increase of palps lately, especially after exercise  ? Prostate nodule 2009  ? Seeing Dr. Laverle PatterBorden. Had another biopsy last year wich was benign. PSAs have been stable.  ? PVC's (premature ventricular contractions)   ? Holter monitor 07/27/12 showed frequent PVCs, representing about 7% of his beats. Has improved since we reduced his caffeine.  ? Reflux esophagitis 2004  ? Improved over last year  ? S/P coronary artery stent placement -Prox LAD, DES 01/31/13  02/01/2013  ? Sleeping difficulties 2002  ? 2-3 nights a wk. Takes Xanax & Melatonin PRN.  ? Varicose vein   ? ? ?Past Surgical History:  ?Procedure Laterality Date  ? [facility]  ? CATARACT EXTRACTION W/ INTRAOCULAR LENS IMPLANT Right 2001  ? CORONARY ANGIOPLASTY  01/31/2013  ? DES TO LAD X2  DR MACELHANY  ? LEFT HEART CATHETERIZATION WITH CORONARY ANGIOGRAM Bilateral 01/31/2013  ? Procedure: LEFT HEART  CATHETERIZATION WITH CORONARY ANGIOGRAM;  Surgeon: Laurey Morale, MD;  Location: [facility] CATH LAB;  Service: Cardiovascular;  Laterality: Bilateral;  ? LESION REMOVAL  11/25/2011  ? Procedure: MINOR EXICISION OF LESION;  Surgeon: Wyn Forster., MD;  Location: [facility];  Service: Orthopedics;  Laterality: Right;  REMOVE THORN RIGHT INDEX FINGER  ? PERCUTANEOUS CORONARY STENT INTERVENTION (PCI-S) Left 01/31/2013  ? Procedure: PERCUTANEOUS CORONARY STENT INTERVENTION (PCI-S);  Surgeon: Laurey Morale, MD;  Location: [facility] CATH LAB;  Service: Cardiovascular;  Laterality: Left;  des x2 prox lad, ffr  ? RETINAL DETACHMENT SURGERY Right 2003   ? With Sling Procedure  ? TONSILLECTOMY  1950  ? ? ?Current Medications: ?Current Meds  ?Medication Sig  ? aspirin EC 81 MG tablet Take 1 tablet (81 mg total) by mouth daily.  ? cholecalciferol (VITAMIN D) 1000 units tablet Take 1,000 Units by mouth daily.  ? Coenzyme Q10 (CO Q 10 PO) Take 200 mg by mouth daily.  ? GRAPE SEED CR PO Take 1 capsule by mouth daily. Muscadine grape seed (resveratol)  ? metFORMIN (GLUCOPHAGE-XR) 500 MG 24 hr tablet TAKE ONE TABLET BY MOUTH TWICE DAILY AFTER MEALS  ? Multiple Vitamins-Minerals (MULTIVITAMIN WITH MINERALS) tablet Take 1 tablet by mouth daily.  ? nitroGLYCERIN (NITROSTAT) 0.4 MG SL tablet Place 1 tablet (0.4 mg total) under the tongue every 5 (five) minutes as needed for chest pain.  ? ofloxacin (OCUFLOX) 0.3 % ophthalmic solution   ? rosuvastatin (CRESTOR) 5 MG tablet Take by mouth.  ? vitamin B-12 (CYANOCOBALAMIN) 1000 MCG tablet Take 1,000 mcg by mouth daily.  ?  ? ?Allergies:   Other  ? ?Social History  ? ?Socioeconomic History  ? Marital status: Married  ?  Spouse name: Not on file  ? Number of children: Not on file  ? Years of education: Not on file  ? Highest education level: Not on file  ?Occupational History  ? Not on file  ?Tobacco Use  ? Smoking status: Never  ? Smokeless tobacco: Never  ?Substance and Sexual Activity  ? Alcohol use: Yes  ?  Alcohol/week: 0.0 standard drinks  ?  Comment: occasionally, 2-3 a month  ? Drug use: No  ? Sexual activity: Not on file  ?Other Topics Concern  ? Not on file  ?Social History Narrative  ? Not on file  ? ?Social Determinants of Health  ? ?Financial Resource Strain: Not on file  ?Food Insecurity: Not on file  ?Transportation Needs: Not on file  ?Physical Activity: Not on file  ?Stress: Not on file  ?Social Connections: Not on file  ?  ? ?Family History:  The patient's  family history includes Anxiety disorder in his sister; Atrial fibrillation (age of onset: 51) in his mother; Bipolar disorder in his sister; CVA in his  mother; Coronary artery disease in his father; Heart attack in his father; Heart disease in his father; Seasonal affective disorder in his sister.  ? ?ROS:   ?Please see the history of present illness.    ?ROS All other systems reviewed and are negative. ? ? ?PHYSICAL EXAM:   ?VS:  BP 124/78   Pulse 61   Ht 6\' 2"  (1.88 m)   Wt 172 lb 6.4 oz (78.2 kg)   SpO2 96%   BMI 22.13 kg/m?   ?Physical Exam  ?GEN: Thin, in no acute distress  ?Neck: no JVD, carotid bruits, or masses ?Cardiac:RRR; some skipping, no murmurs, rubs, or gallops  ?Respiratory:  clear to auscultation bilaterally, normal work of breathing ?GI: soft, nontender, nondistended, + BS ?Ext: without cyanosis, clubbing, or edema, Good distal pulses bilaterally ?Neuro:  Alert and Oriented x 3, ?Psych: euthymic mood, full affect ? ?Wt Readings from Last 3 Encounters:  ?05/20/21 172 lb 6.4 oz (78.2 kg)  ?07/02/20 172 lb (78 kg)  ?05/20/20 176 lb (79.8 kg)  ?  ? ? ?Studies/Labs Reviewed:  ? ?EKG:  EKG is  ordered today.  The ekg ordered today demonstrates NSR, normal EKG ? ?Recent Labs: ?07/02/2020: ALT 23; BUN 14; Creatinine, Ser 0.81; Hemoglobin 13.0; Platelets 179; Potassium 4.8; Sodium 139; TSH 2.500  ? ?Lipid Panel ?   ?Component Value Date/Time  ? CHOL 104 04/26/2014 0850  ? TRIG 50.0 04/26/2014 0850  ? HDL 37.90 (L) 04/26/2014 0850  ? CHOLHDL 3 04/26/2014 0850  ? VLDL 10.0 04/26/2014 0850  ? LDLCALC 56 04/26/2014 0850  ? ? ?Additional studies/ records that were reviewed today include:  ? ?NST 5/2021Nuclear stress EF: 55%. ?Blood pressure demonstrated a hypertensive response to exercise. ?Horizontal ST segment depression ST segment depression of 2 mm was noted during stress in the II, III, aVF, V6, V5 and V4 leads, and returning to baseline after 1-5 minutes of recovery. ?This is a low risk study. ?  ?Abnormal exercise induced EKG changes with 2 mm horizontal ST depression inferiorly and laterally, however, perfusion imaging is normal. LVEF 55% with  normal wall motion. Hypertensive response to exercise. This is a low risk study ? ?ABIs 3/31/2023Summary:  ?Right: Resting right ankle-brachial index is within normal range. No  ?evidence of significant right lower e

## 2021-05-20 ENCOUNTER — Encounter: Payer: Self-pay | Admitting: Physician Assistant

## 2021-05-20 ENCOUNTER — Ambulatory Visit (INDEPENDENT_AMBULATORY_CARE_PROVIDER_SITE_OTHER): Payer: Medicare PPO

## 2021-05-20 ENCOUNTER — Ambulatory Visit: Payer: Medicare PPO | Admitting: Physician Assistant

## 2021-05-20 VITALS — BP 124/78 | HR 61 | Ht 74.0 in | Wt 172.4 lb

## 2021-05-20 DIAGNOSIS — I2510 Atherosclerotic heart disease of native coronary artery without angina pectoris: Secondary | ICD-10-CM

## 2021-05-20 DIAGNOSIS — R002 Palpitations: Secondary | ICD-10-CM

## 2021-05-20 DIAGNOSIS — I714 Abdominal aortic aneurysm, without rupture, unspecified: Secondary | ICD-10-CM

## 2021-05-20 DIAGNOSIS — E7849 Other hyperlipidemia: Secondary | ICD-10-CM | POA: Diagnosis not present

## 2021-05-20 DIAGNOSIS — R0609 Other forms of dyspnea: Secondary | ICD-10-CM

## 2021-05-20 LAB — COMPREHENSIVE METABOLIC PANEL
ALT: 19 IU/L (ref 0–44)
AST: 33 IU/L (ref 0–40)
Albumin/Globulin Ratio: 2 (ref 1.2–2.2)
Albumin: 4.7 g/dL (ref 3.7–4.7)
Alkaline Phosphatase: 52 IU/L (ref 44–121)
BUN/Creatinine Ratio: 15 (ref 10–24)
BUN: 12 mg/dL (ref 8–27)
Bilirubin Total: 0.5 mg/dL (ref 0.0–1.2)
CO2: 27 mmol/L (ref 20–29)
Calcium: 9.4 mg/dL (ref 8.6–10.2)
Chloride: 98 mmol/L (ref 96–106)
Creatinine, Ser: 0.8 mg/dL (ref 0.76–1.27)
Globulin, Total: 2.4 g/dL (ref 1.5–4.5)
Glucose: 94 mg/dL (ref 70–99)
Potassium: 4.9 mmol/L (ref 3.5–5.2)
Sodium: 136 mmol/L (ref 134–144)
Total Protein: 7.1 g/dL (ref 6.0–8.5)
eGFR: 91 mL/min/{1.73_m2} (ref >59)

## 2021-05-20 LAB — CBC
Hematocrit: 39.5 % (ref 37.5–51.0)
Hemoglobin: 13.5 g/dL (ref 13.0–17.7)
MCH: 31.9 pg (ref 26.6–33.0)
MCHC: 34.2 g/dL (ref 31.5–35.7)
MCV: 93 fL (ref 79–97)
Platelets: 171 10*3/uL (ref 150–450)
RBC: 4.23 x10E6/uL (ref 4.14–5.80)
RDW: 12 % (ref 11.6–15.4)
WBC: 4.8 10*3/uL (ref 3.4–10.8)

## 2021-05-20 NOTE — Patient Instructions (Addendum)
Medication Instructions:  ?Your physician recommends that you continue on your current medications as directed. Please refer to the Current Medication list given to you today.  ?*If you need a refill on your cardiac medications before your next appointment, please call your pharmacy* ? ?Lab Work: ?TODAY: CMET, CBC ? ?If you have labs (blood work) drawn today and your tests are completely normal, you will receive your results only by: ?MyChart Message (if you have MyChart) OR ?A paper copy in the mail ?If you have any lab test that is abnormal or we need to change your treatment, we will call you to review the results. ? ?Testing/Procedures: ?Your physician has requested that you have a lexiscan myoview. For further information please visit [email or web address] Please follow instruction sheet, as given. ? ?Follow-Up: ?At [facility], you and your health needs are our priority.  As part of our continuing mission to provide you with exceptional heart care, we have created designated Provider Care Teams.  These Care Teams include your primary Cardiologist (physician) and Advanced Practice Providers (APPs -  Physician Assistants and Nurse Practitioners) who all work together to provide you with the care you need, when you need it. ? ?We recommend signing up for the patient portal called "MyChart".  Sign up information is provided on this After Visit Summary.  MyChart is used to connect with patients for Virtual Visits (Telemedicine).  Patients are able to view lab/test results, encounter notes, upcoming appointments, etc.  Non-urgent messages can be sent to your provider as well.   ?To learn more about what you can do with MyChart, go to [email or web address].   ? ?Your next appointment:   ?5-6 month(s) ? ?The format for your next appointment:   ?In Person ? ?Provider:   ?Lance Muss, MD   ? ? ?Other Instructions ? ?ZIO XT- Long Term Monitor Instructions ? ?Your physician has requested you wear a ZIO patch  monitor for 14 days.  ?This is a single patch monitor. [employer] supplies one patch monitor per enrollment. Additional ?stickers are not available. Please do not apply patch if you will be having a Nuclear Stress Test,  ?Echocardiogram, Cardiac CT, MRI, or Chest Xray during the period you would be wearing the  ?monitor. The patch cannot be worn during these tests. You cannot remove and re-apply the  ?ZIO XT patch monitor.  ?Your ZIO patch monitor will be mailed 3 day USPS to your address on file. It may take 3-5 days  ?to receive your monitor after you have been enrolled.  ?Once you have received your monitor, please review the enclosed instructions. Your monitor  ?has already been registered assigning a specific monitor serial # to you. ? ?Billing and Patient Assistance Program Information ? ?We have supplied [employer] with any of your insurance information on file for billing purposes. ?[employer] offers a sliding scale Patient Assistance Program for patients that do not have  ?insurance, or whose insurance does not completely cover the cost of the ZIO monitor.  ?You must apply for the Patient Assistance Program to qualify for this discounted rate.  ?To apply, please call [employer] at [phone], select option 4, select option 2, ask to apply for  ?Patient Assistance Program. Meredeth Ide will ask your household income, and how many people  ?are in your household. They will quote your out-of-pocket cost based on that information.  ?[employer] will also be able to set up a 36-month, interest-free payment plan if needed. ? ?Applying the monitor ?  ?Shave hair from  upper left chest.  ?Hold abrader disc by orange tab. Rub abrader in 40 strokes over the upper left chest as  ?indicated in your monitor instructions.  ?Clean area with 4 enclosed alcohol pads. Let dry.  ?Apply patch as indicated in monitor instructions. Patch will be placed under collarbone on left  ?side of chest with arrow pointing upward.  ?Rub patch adhesive wings  for 2 minutes. Remove white label marked "1". Remove the white  ?label marked "2". Rub patch adhesive wings for 2 additional minutes.  ?While looking in a mirror, press and release button in center of patch. A small green light will  ?flash 3-4 times. This will be your only indicator that the monitor has been turned on.  ?Do not shower for the first 24 hours. You may shower after the first 24 hours.  ?Press the button if you feel a symptom. You will hear a small click. Record Date, Time and  ?Symptom in the Patient Logbook.  ?When you are ready to remove the patch, follow instructions on the last 2 pages of Patient  ?Logbook. Stick patch monitor onto the last page of Patient Logbook.  ?Place Patient Logbook in the blue and white box. Use locking tab on box and tape box closed  ?securely. The blue and white box has prepaid postage on it. Please place it in the mailbox as  ?soon as possible. Your physician should have your test results approximately 7 days after the  ?monitor has been mailed back to [facility].  ?Call [facility] at [phone] if you have questions regarding  ?your ZIO XT patch monitor. Call them immediately if you see an orange light blinking on your  ?monitor.  ?If your monitor falls off in less than 4 days, contact our Monitor department at [phone].  ?If your monitor becomes loose or falls off after 4 days call [employer] at [phone] for  ?suggestions on securing your monitor ? ?  ?

## 2021-05-20 NOTE — Progress Notes (Unsigned)
Applied a 14 day Zio XT monitor to patient in the office  Dr Varanasi to read 

## 2021-05-21 ENCOUNTER — Other Ambulatory Visit: Payer: Self-pay

## 2021-05-21 DIAGNOSIS — R079 Chest pain, unspecified: Secondary | ICD-10-CM

## 2021-05-21 NOTE — Addendum Note (Signed)
Addended by: Cleda Mccreedy on: 05/21/2021 07:56 AM ? ? Modules accepted: Orders ? ?

## 2021-06-02 ENCOUNTER — Telehealth (HOSPITAL_COMMUNITY): Payer: Self-pay | Admitting: *Deleted

## 2021-06-02 NOTE — Telephone Encounter (Signed)
Left message on voicemail per DPR in reference to upcoming appointment scheduled on  06/08/21 with detailed instructions given per Myocardial Perfusion Study Information Sheet for the test. LM to arrive 15 minutes early, and that it is imperative to arrive on time for appointment to keep from having the test rescheduled. If you need to cancel or reschedule your appointment, please call the office within 24 hours of your appointment. Failure to do so may result in a cancellation of your appointment, and a $50 no show fee. Phone number given for call back for any questions. Ricky Ala, RN  ? ? ?

## 2021-06-04 ENCOUNTER — Encounter (INDEPENDENT_AMBULATORY_CARE_PROVIDER_SITE_OTHER): Payer: Self-pay

## 2021-06-08 ENCOUNTER — Ambulatory Visit (HOSPITAL_COMMUNITY): Payer: Medicare PPO | Attending: Internal Medicine

## 2021-06-08 DIAGNOSIS — I2510 Atherosclerotic heart disease of native coronary artery without angina pectoris: Secondary | ICD-10-CM | POA: Insufficient documentation

## 2021-06-08 DIAGNOSIS — R0609 Other forms of dyspnea: Secondary | ICD-10-CM | POA: Diagnosis present

## 2021-06-08 LAB — MYOCARDIAL PERFUSION IMAGING
Estimated workload: 9
Exercise duration (min): 7 min
Exercise duration (sec): 20 s
LV dias vol: 96 mL (ref 62–150)
LV sys vol: 36 mL
MPHR: 142 {beats}/min
Nuc Stress EF: 62 %
Peak HR: 139 {beats}/min
Percent HR: 97 %
Rest HR: 65 {beats}/min
Rest Nuclear Isotope Dose: 9.8 mCi
SDS: 0
SRS: 0
SSS: 0
Stress Nuclear Isotope Dose: 32.2 mCi
TID: 0.93

## 2021-06-08 MED ORDER — TECHNETIUM TC 99M TETROFOSMIN IV KIT
32.2000 | PACK | Freq: Once | INTRAVENOUS | Status: AC | PRN
  Administered 2021-06-08: 32.2 via INTRAVENOUS

## 2021-06-08 MED ORDER — TECHNETIUM TC 99M TETROFOSMIN IV KIT
9.8000 | PACK | Freq: Once | INTRAVENOUS | Status: AC | PRN
  Administered 2021-06-08: 9.8 via INTRAVENOUS

## 2021-09-06 LAB — LAB REPORT - SCANNED
A1c: 5.5
EGFR: 92

## 2021-11-12 ENCOUNTER — Encounter (INDEPENDENT_AMBULATORY_CARE_PROVIDER_SITE_OTHER): Payer: Medicare PPO | Admitting: Ophthalmology

## 2021-11-19 ENCOUNTER — Encounter (INDEPENDENT_AMBULATORY_CARE_PROVIDER_SITE_OTHER): Payer: Medicare PPO | Admitting: Ophthalmology

## 2021-11-23 ENCOUNTER — Encounter: Payer: Self-pay | Admitting: Interventional Cardiology

## 2021-11-23 ENCOUNTER — Ambulatory Visit: Payer: Medicare PPO | Attending: Interventional Cardiology | Admitting: Interventional Cardiology

## 2021-11-23 VITALS — BP 124/62 | HR 62 | Ht 74.5 in | Wt 170.2 lb

## 2021-11-23 DIAGNOSIS — I700 Atherosclerosis of aorta: Secondary | ICD-10-CM

## 2021-11-23 DIAGNOSIS — I25118 Atherosclerotic heart disease of native coronary artery with other forms of angina pectoris: Secondary | ICD-10-CM | POA: Diagnosis not present

## 2021-11-23 DIAGNOSIS — I493 Ventricular premature depolarization: Secondary | ICD-10-CM

## 2021-11-23 DIAGNOSIS — E782 Mixed hyperlipidemia: Secondary | ICD-10-CM

## 2021-11-23 DIAGNOSIS — I10 Essential (primary) hypertension: Secondary | ICD-10-CM

## 2021-11-23 NOTE — Progress Notes (Signed)
Cardiology Office Note   Date:  11/23/2021   ID:  Billy Adams, DOB [birth year], MRN [id number]  PCP:  Mosetta Putt, MD    No chief complaint on file.  CAD  Wt Readings from Last 3 Encounters:  11/23/21 170 lb 3.2 oz (77.2 kg)  06/08/21 172 lb (78 kg)  05/20/21 172 lb 6.4 oz (78.2 kg)       History of Present Illness: Billy Adams is a 76 y.o. male   who has a h/o PVCs and CAD.  Since high school, he can remember feeling palpitations with stressful events (public speaking, band solo, etc). He began to notice irregularity in his pulse after exercise.  He was seen by Dr. Duaine Dredge and had a holter monitor in 7/14 that showed frequent PVCs (7% of total complexes).  He cut caffeine out almost entirely but continued to have PVCs with and after exercise, seeral years ago.  This improved a few years later- see below.    He had 2 DES to the LAD in 2014.  Other mild disease noted.  Only sx he could think of were pain between the shoulder blades with exercise.  PVCs were the biggest indicator at that time.     PVCs decreased after PCI.   He has had diagnosis of aortic atherosclerosis and had a small AAA, diagnosed in 2012. 5/18 ultrasound showed: Mildly dilated aorta (up to 2.8 cm), no intervention required.  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     Crestor increased to 10 mg daily in 2021.   He had frequent leg cramps in 2022.  Worse with sitting.  He talked to [facility] and he went from 10 mg to 5 mg.     He is concerned about long term side effects of statins.  A PT neighbor mentioned that his dorsiflexion is reduced.  Total cholesterol was 121 in 10/21.    Gets tired more easily, particularly if he walks after a meal.  Felt a tightness on one occasion in his chest that resolved spontaneously.  Feels better walking on a cool dry day. No sx like what he had in 2014.   5/23 stress test: "Exercise stress test:  Clinically negative, electrically negative for  ischemia.   Myoview scan shows normal perfusion   The study is normal. The study is low risk.   LVEF calcuated at 62% with normal wall motion   Low risk study"  Still concerned about CNS effects of statins. Eating more healthy fats.   Past Medical History:  Diagnosis Date   AAA (abdominal aortic aneurysm) (HCC)    pt denies   Allergy    seasonal   Arthralgia of foot    bilateral   BPH (benign prostatic hypertrophy) 1999   CAD (coronary artery disease), residual post PCI with 40% LM, 30% LCX 12/25/2012   Cataract    surgery   Coronary artery disease    DES TO LAD X2   Degenerative disk disease    of cervical spine w/occasional radicular symptoms since 1985   Detached retina 2003   Detached retina    When right cataract was repaired, so Dr. Luciana Axe has recommened delaying the left side repair longer so there will be less chance of detachedment of the retina again.   GERD (gastroesophageal reflux disease)    occasionally-relief with tums or zantac   Hypercholesterolemia    Hyperlipidemia 1995   Hypertension    Impaired glucose tolerance 2011   Lactose intolerance  Left cataract    Osteoarthritis of both feet    Palpitations    Pt has noticed an increase of palps lately, especially after exercise   Prostate nodule 2009   Seeing Dr. Laverle Patter. Had another biopsy last year wich was benign. PSAs have been stable.   PVC's (premature ventricular contractions)    Holter monitor 07/27/12 showed frequent PVCs, representing about 7% of his beats. Has improved since we reduced his caffeine.   Reflux esophagitis 2004   Improved over last year   S/P coronary artery stent placement -Prox LAD, DES 01/31/13  02/01/2013   Sleeping difficulties 2002   2-3 nights a wk. Takes Xanax & Melatonin PRN.   Varicose vein     Past Surgical History:  Procedure Laterality Date   CARDIAC CATHETERIZATION  01/31/2013   DR [facility]   CATARACT EXTRACTION W/ INTRAOCULAR LENS IMPLANT Right 2001   CORONARY  ANGIOPLASTY  01/31/2013   DES TO LAD X2  DR [facility]   LEFT HEART CATHETERIZATION WITH CORONARY ANGIOGRAM Bilateral 01/31/2013   Procedure: LEFT HEART CATHETERIZATION WITH CORONARY ANGIOGRAM;  Surgeon: Laurey Morale, MD;  Location: [facility] CATH LAB;  Service: Cardiovascular;  Laterality: Bilateral;   LESION REMOVAL  11/25/2011   Procedure: MINOR EXICISION OF LESION;  Surgeon: Wyn Forster., MD;  Location: [facility];  Service: Orthopedics;  Laterality: Right;  REMOVE THORN RIGHT INDEX FINGER   PERCUTANEOUS CORONARY STENT INTERVENTION (PCI-S) Left 01/31/2013   Procedure: PERCUTANEOUS CORONARY STENT INTERVENTION (PCI-S);  Surgeon: Laurey Morale, MD;  Location: [facility] CATH LAB;  Service: Cardiovascular;  Laterality: Left;  des x2 prox lad, ffr   RETINAL DETACHMENT SURGERY Right 2003   With Sling Procedure   TONSILLECTOMY  1950     Current Outpatient Medications  Medication Sig Dispense Refill   aspirin EC 81 MG tablet Take 1 tablet (81 mg total) by mouth daily. 90 tablet 3   cholecalciferol (VITAMIN D) 1000 units tablet Take 1,000 Units by mouth daily.     Coenzyme Q10 (CO Q 10 PO) Take 200 mg by mouth daily.     COVID-19 mRNA vaccine 2023-2024 (COMIRNATY) SUSP injection Inject into the muscle.     GRAPE SEED CR PO Take 1 capsule by mouth daily. Muscadine grape seed (resveratol)     MELATONIN PO Take 3 mg by mouth as needed (for sleep).     metFORMIN (GLUCOPHAGE-XR) 500 MG 24 hr tablet TAKE ONE TABLET BY MOUTH TWICE DAILY AFTER MEALS     Multiple Vitamins-Minerals (MULTIVITAMIN WITH MINERALS) tablet Take 1 tablet by mouth daily.     nitroGLYCERIN (NITROSTAT) 0.4 MG SL tablet Place 1 tablet (0.4 mg total) under the tongue every 5 (five) minutes as needed for chest pain. 100 tablet 3   Omega-3 Fatty Acids (FISH OIL PO) Take 1 capsule by mouth daily.     rosuvastatin (CRESTOR) 5 MG tablet Take by mouth.     vitamin B-12 (CYANOCOBALAMIN) 1000 MCG tablet Take 1,000 mcg by mouth  daily.     ofloxacin (OCUFLOX) 0.3 % ophthalmic solution  (Patient not taking: Reported on 11/23/2021)     No current facility-administered medications for this visit.    Allergies:   Other    Social History:  The patient  reports that he has never smoked. He has never used smokeless tobacco. He reports current alcohol use. He reports that he does not use drugs.   Family History:  The patient's family history includes Anxiety disorder in  his sister; Atrial fibrillation (age of onset: 55) in his mother; Bipolar disorder in his sister; CVA in his mother; Coronary artery disease in his father; Heart attack in his father; Heart disease in his father; Seasonal affective disorder in his sister.    ROS:  Please see the history of present illness.   Otherwise, review of systems are positive for increased fatigue.   All other systems are reviewed and negative.    PHYSICAL EXAM: VS:  BP 124/62   Pulse 62   Ht 6' 2.5" (1.892 m)   Wt 170 lb 3.2 oz (77.2 kg)   SpO2 99%   BMI 21.56 kg/m  , BMI Body mass index is 21.56 kg/m. GEN: Well nourished, well developed, in no acute distress HEENT: normal Neck: no JVD, carotid bruits, or masses Cardiac: RRR; no murmurs, rubs, or gallops,no edema  Respiratory:  clear to auscultation bilaterally, normal work of breathing GI: soft, nontender, nondistended, + BS MS: no deformity or atrophy Skin: warm and dry, no rash Neuro:  Strength and sensation are intact Psych: euthymic mood, full affect   EKG:   The ekg ordered in 5/23 demonstrates NSR, no ST changes   Recent Labs: 05/20/2021: ALT 19; BUN 12; Creatinine, Ser 0.80; Hemoglobin 13.5; Platelets 171; Potassium 4.9; Sodium 136   Lipid Panel    Component Value Date/Time   CHOL 104 04/26/2014 0850   TRIG 50.0 04/26/2014 0850   HDL 37.90 (L) 04/26/2014 0850   CHOLHDL 3 04/26/2014 0850   VLDL 10.0 04/26/2014 0850   LDLCALC 56 04/26/2014 0850     Other studies Reviewed: Additional studies/ records  that were reviewed today with results demonstrating: labs reviewed.   ASSESSMENT AND PLAN:  CAD :  Some fatigue.  We discussed repeat cath if sx persist or increase.  Given the normal stent stress test, it is reasonable to try to increase stamina with more exercise.  If symptoms Worsen or persist, would go ahead and plan for heart catheterization.  If he has any feelings that remind him of what he felt prior to his stents in 2014, I would have a low threshold for cardiac cath. PVCs: stable.  Hyperlipidemia: Concerned about long term statins.   Followed by PMD. Has had NMR lipid test. Aortic atherosclerosis: Continue statin.  Whole food, plant-based diet.  High-fiber diet.  Avoiding processed foods.   Current medicines are reviewed at length with the patient today.  The patient concerns regarding his medicines were addressed.  The following changes have been made:  No change  Labs/ tests ordered today include:  No orders of the defined types were placed in this encounter.   Recommend 150 minutes/week of aerobic exercise Low fat, low carb, high fiber diet recommended  Disposition:   FU in 6 months   Signed, Larae Grooms, MD  11/23/2021 9:55 AM    [facility] [place], [place], [place]  [address] Phone: [phone]; Fax: [phone]

## 2021-11-23 NOTE — Patient Instructions (Signed)
Medication Instructions:  Your physician recommends that you continue on your current medications as directed. Please refer to the Current Medication list given to you today.  *If you need a refill on your cardiac medications before your next appointment, please call your pharmacy*   Lab Work: none If you have labs (blood work) drawn today and your tests are completely normal, you will receive your results only by: MyChart Message (if you have MyChart) OR A paper copy in the mail If you have any lab test that is abnormal or we need to change your treatment, we will call you to review the results.   Testing/Procedures: none   Follow-Up: At [facility], you and your health needs are our priority.  As part of our continuing mission to provide you with exceptional heart care, we have created designated Provider Care Teams.  These Care Teams include your primary Cardiologist (physician) and Advanced Practice Providers (APPs -  Physician Assistants and Nurse Practitioners) who all work together to provide you with the care you need, when you need it.  We recommend signing up for the patient portal called "MyChart".  Sign up information is provided on this After Visit Summary.  MyChart is used to connect with patients for Virtual Visits (Telemedicine).  Patients are able to view lab/test results, encounter notes, upcoming appointments, etc.  Non-urgent messages can be sent to your provider as well.   To learn more about what you can do with MyChart, go to [email or web address].    Your next appointment:   6 month(s)  The format for your next appointment:   In Person  Provider:   Jayadeep Varanasi, MD     Other Instructions    Important Information About Sugar       

## 2022-06-18 ENCOUNTER — Encounter: Payer: Self-pay | Admitting: Physician Assistant

## 2022-06-18 ENCOUNTER — Ambulatory Visit: Payer: Medicare PPO | Attending: Physician Assistant | Admitting: Physician Assistant

## 2022-06-18 VITALS — BP 128/70 | HR 64 | Ht 74.5 in | Wt 171.8 lb

## 2022-06-18 DIAGNOSIS — I700 Atherosclerosis of aorta: Secondary | ICD-10-CM

## 2022-06-18 DIAGNOSIS — I493 Ventricular premature depolarization: Secondary | ICD-10-CM

## 2022-06-18 DIAGNOSIS — E785 Hyperlipidemia, unspecified: Secondary | ICD-10-CM | POA: Diagnosis not present

## 2022-06-18 DIAGNOSIS — I2510 Atherosclerotic heart disease of native coronary artery without angina pectoris: Secondary | ICD-10-CM

## 2022-06-18 NOTE — Patient Instructions (Signed)
Medication Instructions:   Your physician recommends that you continue on your current medications as directed. Please refer to the Current Medication list given to you today.  *If you need a refill on your cardiac medications before your next appointment, please call your pharmacy*   Lab Work: NONE ORDERED  TODAY   If you have labs (blood work) drawn today and your tests are completely normal, you will receive your results only by: MyChart Message (if you have MyChart) OR A paper copy in the mail If you have any lab test that is abnormal or we need to change your treatment, we will call you to review the results.   Testing/Procedures: NONE ORDERED  TODAY    Follow-Up: At [facility], you and your health needs are our priority.  As part of our continuing mission to provide you with exceptional heart care, we have created designated Provider Care Teams.  These Care Teams include your primary Cardiologist (physician) and Advanced Practice Providers (APPs -  Physician Assistants and Nurse Practitioners) who all work together to provide you with the care you need, when you need it.  We recommend signing up for the patient portal called "MyChart".  Sign up information is provided on this After Visit Summary.  MyChart is used to connect with patients for Virtual Visits (Telemedicine).  Patients are able to view lab/test results, encounter notes, upcoming appointments, etc.  Non-urgent messages can be sent to your provider as well.   To learn more about what you can do with MyChart, go to [email or web address].    Your next appointment:    6 month(s)  Provider:   Lance Muss, MD     Other Instructions  You have been recommended to exercise 30 mins a day 5 days a week.  Heart-Healthy Eating Plan Eating a healthy diet is important for the health of your heart. A heart-healthy eating plan includes: Eating less unhealthy fats. Eating more healthy fats. Eating less salt  in your food. Salt is also called sodium. Making other changes in your diet. Talk with your doctor or a diet specialist (dietitian) to create an eating plan that is right for you. What is my plan? Your doctor may recommend an eating plan that includes: Total fat: ______% or less of total calories a day. Saturated fat: ______% or less of total calories a day. Cholesterol: less than _________mg a day. Sodium: less than _________mg a day. What are tips for following this plan? Cooking Avoid frying your food. Try to bake, boil, grill, or broil it instead. You can also reduce fat by: Removing the skin from poultry. Removing all visible fats from meats. Steaming vegetables in water or broth. Meal planning  At meals, divide your plate into four equal parts: Fill one-half of your plate with vegetables and green salads. Fill one-fourth of your plate with whole grains. Fill one-fourth of your plate with lean protein foods. Eat 2-4 cups of vegetables per day. One cup of vegetables is: 1 cup (91 g) broccoli or cauliflower florets. 2 medium carrots. 1 large bell pepper. 1 large sweet potato. 1 large tomato. 1 medium white potato. 2 cups (150 g) raw leafy greens. Eat 1-2 cups of fruit per day. One cup of fruit is: 1 small apple 1 large banana 1 cup (237 g) mixed fruit, 1 large orange,  cup (82 g) dried fruit, 1 cup (240 mL) 100% fruit juice. Eat more foods that have soluble fiber. These are apples, broccoli, carrots, beans, peas, and  barley. Try to get 20-30 g of fiber per day. Eat 4-5 servings of nuts, legumes, and seeds per week: 1 serving of dried beans or legumes equals  cup (90 g) cooked. 1 serving of nuts is  oz (12 almonds, 24 pistachios, or 7 walnut halves). 1 serving of seeds equals  oz (8 g). General information Eat more home-cooked food. Eat less restaurant, buffet, and fast food. Limit or avoid alcohol. Limit foods that are high in starch and sugar. Avoid fried  foods. Lose weight if you are overweight. Keep track of how much salt (sodium) you eat. This is important if you have high blood pressure. Ask your doctor to tell you more about this. Try to add vegetarian meals each week. Fats Choose healthy fats. These include olive oil and canola oil, flaxseeds, walnuts, almonds, and seeds. Eat more omega-3 fats. These include salmon, mackerel, sardines, tuna, flaxseed oil, and ground flaxseeds. Try to eat fish at least 2 times each week. Check food labels. Avoid foods with trans fats or high amounts of saturated fat. Limit saturated fats. These are often found in animal products, such as meats, butter, and cream. These are also found in plant foods, such as palm oil, palm kernel oil, and coconut oil. Avoid foods with partially hydrogenated oils in them. These have trans fats. Examples are stick margarine, some tub margarines, cookies, crackers, and other baked goods. What foods should I eat? Fruits All fresh, canned (in natural juice), or frozen fruits. Vegetables Fresh or frozen vegetables (raw, steamed, roasted, or grilled). Green salads. Grains Most grains. Choose whole wheat and whole grains most of the time. Rice and pasta, including brown rice and pastas made with whole wheat. Meats and other proteins Lean, well-trimmed beef, veal, pork, and lamb. Chicken and [place] without skin. All fish and shellfish. Wild duck, rabbit, pheasant, and venison. Egg whites or low-cholesterol egg substitutes. Dried beans, peas, lentils, and tofu. Seeds and most nuts. Dairy Low-fat or nonfat cheeses, including ricotta and mozzarella. Skim or 1% milk that is liquid, powdered, or evaporated. Buttermilk that is made with low-fat milk. Nonfat or low-fat yogurt. Fats and oils Non-hydrogenated (trans-free) margarines. Vegetable oils, including soybean, sesame, sunflower, olive, peanut, safflower, corn, canola, and cottonseed. Salad dressings or mayonnaise made with a  vegetable oil. Beverages Mineral water. Coffee and tea. Diet carbonated beverages. Sweets and desserts Sherbet, gelatin, and fruit ice. Small amounts of dark chocolate. Limit all sweets and desserts. Seasonings and condiments All seasonings and condiments. The items listed above may not be a complete list of foods and drinks you can eat. Contact a dietitian for more options. What foods should I avoid? Fruits Canned fruit in heavy syrup. Fruit in cream or butter sauce. Fried fruit. Limit coconut. Vegetables Vegetables cooked in cheese, cream, or butter sauce. Fried vegetables. Grains Breads that are made with saturated or trans fats, oils, or whole milk. Croissants. Sweet rolls. Donuts. High-fat crackers, such as cheese crackers. Meats and other proteins Fatty meats, such as hot dogs, ribs, sausage, bacon, rib-eye roast or steak. High-fat deli meats, such as salami and bologna. Caviar. Domestic duck and goose. Organ meats, such as liver. Dairy Cream, sour cream, cream cheese, and creamed cottage cheese. Whole-milk cheeses. Whole or 2% milk that is liquid, evaporated, or condensed. Whole buttermilk. Cream sauce or high-fat cheese sauce. Yogurt that is made from whole milk. Fats and oils Meat fat, or shortening. Cocoa butter, hydrogenated oils, palm oil, coconut oil, palm kernel oil. Solid fats and shortenings,  including bacon fat, salt pork, lard, and butter. Nondairy cream substitutes. Salad dressings with cheese or sour cream. Beverages Regular sodas and juice drinks with added sugar. Sweets and desserts Frosting. Pudding. Cookies. Cakes. Pies. Milk chocolate or white chocolate. Buttered syrups. Full-fat ice cream or ice cream drinks. The items listed above may not be a complete list of foods and drinks to avoid. Contact a dietitian for more information. Summary Heart-healthy meal planning includes eating less unhealthy fats, eating more healthy fats, and making other changes in your  diet. Eat a balanced diet. This includes fruits and vegetables, low-fat or nonfat dairy, lean protein, nuts and legumes, whole grains, and heart-healthy oils and fats. This information is not intended to replace advice given to you by your health care provider. Make sure you discuss any questions you have with your health care provider. Document Revised: 02/09/2021 Document Reviewed: 02/09/2021 Elsevier Patient Education  2024 [employer] Low-Sodium Eating Plan Salt (sodium) helps you keep a healthy balance of fluids in your body. Too much sodium can raise your blood pressure. It can also cause fluid and waste to be held in your body. Your health care provider or dietitian may recommend a low-sodium eating plan if you have high blood pressure (hypertension), kidney disease, liver disease, or heart failure. Eating less sodium can help lower your blood pressure and reduce swelling. It can also protect your heart, liver, and kidneys. What are tips for following this plan? Reading food labels  Check food labels for the amount of sodium per serving. If you eat more than one serving, you must multiply the listed amount by the number of servings. Choose foods with less than 140 milligrams (mg) of sodium per serving. Avoid foods with 300 mg of sodium or more per serving. Always check how much sodium is in a product, even if the label says "unsalted" or "no salt added." Shopping  Buy products labeled as "low-sodium" or "no salt added." Buy fresh foods. Avoid canned foods and pre-made or frozen meals. Avoid canned, cured, or processed meats. Buy breads that have less than 80 mg of sodium per slice. Cooking  Eat more home-cooked food. Try to eat less restaurant, buffet, and fast food. Try not to add salt when you cook. Use salt-free seasonings or herbs instead of table salt or sea salt. Check with your provider or pharmacist before using salt substitutes. Cook with plant-based oils, such as canola,  sunflower, or olive oil. Meal planning When eating at a restaurant, ask if your food can be made with less salt or no salt. Avoid dishes labeled as brined, pickled, cured, or smoked. Avoid dishes made with soy sauce, miso, or teriyaki sauce. Avoid foods that have monosodium glutamate (MSG) in them. MSG may be added to some restaurant food, sauces, soups, bouillon, and canned foods. Make meals that can be grilled, baked, poached, roasted, or steamed. These are often made with less sodium. General information Try to limit your sodium intake to 1,500-2,300 mg each day, or the amount told by your provider. What foods should I eat? Fruits Fresh, frozen, or canned fruit. Fruit juice. Vegetables Fresh or frozen vegetables. "No salt added" canned vegetables. "No salt added" tomato sauce and paste. Low-sodium or reduced-sodium tomato and vegetable juice. Grains Low-sodium cereals, such as oats, puffed wheat and rice, and shredded wheat. Low-sodium crackers. Unsalted rice. Unsalted pasta. Low-sodium bread. Whole grain breads and whole grain pasta. Meats and other proteins Fresh or frozen meat, poultry, seafood, and fish. These should  have no added salt. Low-sodium canned tuna and salmon. Unsalted nuts. Dried peas, beans, and lentils without added salt. Unsalted canned beans. Eggs. Unsalted nut butters. Dairy Milk. Soy milk. Cheese that is naturally low in sodium, such as ricotta cheese, fresh mozzarella, or Swiss cheese. Low-sodium or reduced-sodium cheese. Cream cheese. Yogurt. Seasonings and condiments Fresh and dried herbs and spices. Salt-free seasonings. Low-sodium mustard and ketchup. Sodium-free salad dressing. Sodium-free light mayonnaise. Fresh or refrigerated horseradish. Lemon juice. Vinegar. Other foods Homemade, reduced-sodium, or low-sodium soups. Unsalted popcorn and pretzels. Low-salt or salt-free chips. The items listed above may not be all the foods and drinks you can have. Talk to a  dietitian to learn more. What foods should I avoid? Vegetables Sauerkraut, pickled vegetables, and relishes. Olives. [place] fries. Onion rings. Regular canned vegetables, except low-sodium or reduced-sodium items. Regular canned tomato sauce and paste. Regular tomato and vegetable juice. Frozen vegetables in sauces. Grains Instant hot cereals. Bread stuffing, pancake, and biscuit mixes. Croutons. Seasoned rice or pasta mixes. Noodle soup cups. Boxed or frozen macaroni and cheese. Regular salted crackers. Self-rising flour. Meats and other proteins Meat or fish that is salted, canned, smoked, spiced, or pickled. Precooked or cured meat, such as sausages or meat loaves. Tomasa Blase. Ham. Pepperoni. Hot dogs. Corned beef. Chipped beef. Salt pork. Jerky. Pickled herring, anchovies, and sardines. Regular canned tuna. Salted nuts. Dairy Processed cheese and cheese spreads. Hard cheeses. Cheese curds. Blue cheese. Feta cheese. String cheese. Regular cottage cheese. Buttermilk. Canned milk. Fats and oils Salted butter. Regular margarine. Ghee. Bacon fat. Seasonings and condiments Onion salt, garlic salt, seasoned salt, table salt, and sea salt. Canned and packaged gravies. Worcestershire sauce. Tartar sauce. Barbecue sauce. Teriyaki sauce. Soy sauce, including reduced-sodium soy sauce. Steak sauce. Fish sauce. Oyster sauce. Cocktail sauce. Horseradish that you find on the shelf. Regular ketchup and mustard. Meat flavorings and tenderizers. Bouillon cubes. Hot sauce. Pre-made or packaged marinades. Pre-made or packaged taco seasonings. Relishes. Regular salad dressings. Salsa. Other foods Salted popcorn and pretzels. Corn chips and puffs. Potato and tortilla chips. Canned or dried soups. Pizza. Frozen entrees and pot pies. The items listed above may not be all the foods and drinks you should avoid. Talk to a dietitian to learn more. This information is not intended to replace advice given to you by your health  care provider. Make sure you discuss any questions you have with your health care provider. Document Revised: 01/21/2022 Document Reviewed: 01/21/2022 Elsevier Patient Education  2024 [employer].

## 2022-06-18 NOTE — Progress Notes (Addendum)
Office Visit    Patient Name: Billy Adams Date of Encounter: 06/18/2022  PCP:  Mosetta Putt, MD   [facility]  Cardiologist:  Lance Muss, MD  Advanced Practice Provider:  No care team member to display Electrophysiologist:  None   HPI    Billy Adams is a 77 y.o. male with a past medical history of PVCs and CAD presents today for follow-up appointment.  Since high school, he can remember feeling palpitations with stressful events (public speaking, band swallow, etc.).  He began to notice irregularity in his pulse after exercise.  He was seen by Dr. Duaine Dredge and had a Holter monitor 7/14 that showed frequent PVCs (7% burden).  He cut caffeine out almost entirely but continued to have PVCs with and after exercise.  This improved for years later.  He had 2 DES to LAD in 2014.  Other mild disease noted.  Only symptoms he could think of were pain between the shoulder blades with exercise.  PVCs were the biggest indicator at that time.  PVCs did decrease after PCI.  He was diagnosed with aortic atherosclerosis and a small AAA (diagnosed in 2012).  5/18 ultrasound showed: Mildly dilated aorta (up to 2.8 cm), no intervention required at that time.  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  Increase Crestor to 10 mg daily in 2021.  He did endorse of frequent leg cramps in 2022 and went down to 5 mg on the Crestor.  Concerned with long-term side effects of statins.  PT neighbor mentioned that his dorsiflexion was reduced.  Total cholesterol was 121 and 10/21.  He was getting tired more easily particularly after walking.  Felt a tightness on 1 occasion in his chest that resolved spontaneously.  No symptoms like he had in 2014.  Stress test 5/23 showed clinically negative stress test, electrically negative for ischemia.  Myoview scans showed normal perfusion.  LVEF calculated at 62% with normal wall motion.  Still concerned about the CNS side effects of  statins.  Eating more healthy fats.  Today, he tells me that he has not had any chest pain.  He does have occasional shortness of breath.  When he is walking this occurs after he eats or if it is really humid outside.  He tries to avoid humidity.  Typically after 1050 minutes he gets a little short of breath.  Seems gradual.  There is a healthy and with his walk.  He does not want O2 dizziness but has not been going recently.  When he was going, he would interval training for about 20 to 30 minutes total.  He does a lot of outdoor gardening and mows lawn.  He tries to remain active.  He has maintained a plant-based diet.  Back during the time of his stents he had a pain in the middle of his back.  This was his anginal equivalent.  He has not had this pain.  He was having weakness in certain muscles in his legs.  He was diagnosed with constriction of the nerves.  He does everything that he wants evaluated.  His primary care put him on and his LDL has improved.  Reports no shortness of breath nor dyspnea on exertion. Reports no chest pain, pressure, or tightness. No edema, orthopnea, PND. Reports no palpitations.   Past Medical History    Past Medical History:  Diagnosis Date   AAA (abdominal aortic aneurysm) (HCC)    pt denies   Allergy  seasonal   Arthralgia of foot    bilateral   BPH (benign prostatic hypertrophy) 1999   CAD (coronary artery disease), residual post PCI with 40% LM, 30% LCX 12/25/2012   Cataract    surgery   Coronary artery disease    DES TO LAD X2   Degenerative disk disease    of cervical spine w/occasional radicular symptoms since 1985   Detached retina 2003   Detached retina    When right cataract was repaired, so Dr. Luciana Axe has recommened delaying the left side repair longer so there will be less chance of detachedment of the retina again.   GERD (gastroesophageal reflux disease)    occasionally-relief with tums or zantac   Hypercholesterolemia    Hyperlipidemia  1995   Hypertension    Impaired glucose tolerance 2011   Lactose intolerance    Left cataract    Osteoarthritis of both feet    Palpitations    Pt has noticed an increase of palps lately, especially after exercise   Prostate nodule 2009   Seeing Dr. Laverle Patter. Had another biopsy last year wich was benign. PSAs have been stable.   PVC's (premature ventricular contractions)    Holter monitor 07/27/12 showed frequent PVCs, representing about 7% of his beats. Has improved since we reduced his caffeine.   Reflux esophagitis 2004   Improved over last year   S/P coronary artery stent placement -Prox LAD, DES 01/31/13  02/01/2013   Sleeping difficulties 2002   2-3 nights a wk. Takes Xanax & Melatonin PRN.   Varicose vein    Past Surgical History:  Procedure Laterality Date   CARDIAC CATHETERIZATION  01/31/2013   DR [facility]   CATARACT EXTRACTION W/ INTRAOCULAR LENS IMPLANT Right 2001   CORONARY ANGIOPLASTY  01/31/2013   DES TO LAD X2  DR [facility]   LEFT HEART CATHETERIZATION WITH CORONARY ANGIOGRAM Bilateral 01/31/2013   Procedure: LEFT HEART CATHETERIZATION WITH CORONARY ANGIOGRAM;  Surgeon: Laurey Morale, MD;  [facility];  Service: Cardiovascular;  Laterality: Bilateral;   LESION REMOVAL  11/25/2011   Procedure: MINOR EXICISION OF LESION;  Surgeon: Wyn Forster., MD;  Location: [facility];  Service: Orthopedics;  Laterality: Right;  REMOVE THORN RIGHT INDEX FINGER   PERCUTANEOUS CORONARY STENT INTERVENTION (PCI-S) Left 01/31/2013   Procedure: PERCUTANEOUS CORONARY STENT INTERVENTION (PCI-S);  Surgeon: Laurey Morale, MD;  Location: [facility] CATH LAB;  Service: Cardiovascular;  Laterality: Left;  des x2 prox lad, ffr   RETINAL DETACHMENT SURGERY Right 2003   With Sling Procedure   TONSILLECTOMY  1950    Allergies  Allergies  Allergen Reactions   Other     RYE> diarrhea, cats, tobacco smoke, alternaria     EKGs/Labs/Other Studies Reviewed:   The following  studies were reviewed today: Cardiac Studies & Procedures     STRESS TESTS  MYOCARDIAL PERFUSION IMAGING 06/08/2021  Narrative   Exercise stress test:  Clinically negative, electrically negative for ischemia.   Myoview scan shows normal perfusion   The study is normal. The study is low risk.   LVEF calcuated at 62% with normal wall motion   Low risk study     MONITORS  LONG TERM MONITOR (3-14 DAYS) 06/10/2021  Narrative  Normal sinus rhythm with episodes of SVT.  Single, brief episode of nonsustained VT.  No atrial fibrillation.   Patch Wear Time:  14 days and 0 hours (2023-05-03T10:47:38-0400 to 2023-05-17T10:47:42-0400)  Patient had a min HR of 47 bpm,  max HR of 207 bpm, and avg HR of 65 bpm. Predominant underlying rhythm was Sinus Rhythm. 1 run of Ventricular Tachycardia occurred lasting 6 beats with a max rate of 207 bpm (avg 174 bpm). 11 Supraventricular Tachycardia runs occurred, the run with the fastest interval lasting 4 beats with a max rate of 174 bpm, the longest lasting 10 beats with an avg rate of 110 bpm. Isolated SVEs were rare (<1.0%), SVE Couplets were rare (<1.0%), and SVE Triplets were rare (<1.0%). Isolated VEs were occasional (3.4%, 44648), VE Couplets were rare (<1.0%, 258), and VE Triplets were rare (<1.0%, 7). Ventricular Bigeminy and Trigeminy were present.   CT SCANS  CT CORONARY MORPH W/CTA COR W/SCORE 12/22/2012  Addendum 12/22/2012  6:01 PM ADDENDUM REPORT: 12/22/2012 17:58  ADDENDUM: OVER-READ INTERPRETATION  CT CHEST  The following report is an over-read performed by radiologist Dr. Berna [facility], PA on 12/22/2012. This over-read does not include interpretation of cardiac or coronary anatomy or pathology. The CTA interpretation by the cardiologist is attached.  FINDINGS: 6 mm linear nodule associated with the minor fissure (image 6 of series 5). No other larger more suspicious appearing pulmonary nodules or masses are  noted in the visualized portions of the lungs. Within the visualized thorax there is no acute consolidative airspace disease, and no pleural effusions. Visualized portions of the upper abdomen are unremarkable. There are no aggressive appearing lytic or blastic lesions noted in the visualized portions of the skeleton.  IMPRESSION: 1. 6 mm nodule associated with the minor fissure is very elongated in appearance, and may simply represent a benign subpleural lymph node or focal area of fissural thickening. However, this is nonspecific. If the patient is at high risk for bronchogenic carcinoma, follow-up chest CT at 6-12 months is recommended. If the patient is at low risk for bronchogenic carcinoma, follow-up chest CT at 12 months is recommended. This recommendation follows the consensus statement: Guidelines for Management of Small Pulmonary Nodules Detected on CT Scans: A Statement from the Fleischner Society as published in Radiology 2005;237:395-400.   Electronically Signed By: Trudie Reed M.D. On: 12/22/2012 17:58  Narrative CLINICAL DATA:  Chest pain, abnormal ETT  EXAM: Cardiac CTA  MEDICATIONS: Sub lingual nitro .4mg   TECHNIQUE: The patient was scanned on a Philips 256 slice scanner. Gantry rotation speed was 270 msecs. Collimation was .9mm. A 100 kV prospective scan was triggered in the descending thoracic aorta at 111 HU's with 5% padding centered around 78% of the R-R interval. Average HR during the scan was 56 bpm. The 3D data set was interpreted on a dedicated work station using MPR, MIP and VRT modes. A total of 80cc of contrast was used.  FINDINGS: Non-cardiac: See separate report from [facility].  Calcium Score:  1659 Agatston units  Coronary Arteries: Right dominant with no anomalies  LM: There was calcified plaque in the left main with no more than mild stenosis.  LAD: There was extensive calcified plaque in the ostial and  proximal LAD. The proximal LAD had circumferential calcification. This makes interpretation difficult due to the possibility of shadowing artifact, but I am concerned that there is a moderate to severe proximal LAD stenosis. There was mixed plaque in the mid LAD with mild to possibly moderate stenosis.  D1: Small to moderate vessel with mixed plaque proximally and mild to possibly moderate stenosis.  Circumflex: Calcified plaque in the proximal and mid vessel with no more than mild stenosis.  OM1:  Tiny vessel.  OM2:  Moderate vessel, no significant disease.  RCA: Mixed plaque throughout the RCA with possible mild stenosis in several areas.  PDA:  Calcified plaque proximally with no more than mild stenosis.  PLA:  Calcified plaque proximally with no more than mild stenosis.  IMPRESSION: 1. Coronary artery calcium score 1659 Agatston units, placing the patient in the 95th percentile for his age and gender. This suggests that he is in a high risk cohort for future cardiac events.  2. Extensive atherosclerotic plaque in the coronary tree. The most significant lesion appears to be in the proximal LAD. I am concerned that the patient has a moderate to severe stenosis.  [employer] account executive  Electronically Signed: By: Marca Ancona M.D. On: 12/20/2012 15:12           EKG:  EKG is  ordered today.  The ekg ordered today demonstrates normal sinus rhythm, singular PVC  Recent Labs: No results found for requested labs within last 365 days.  Recent Lipid Panel    Component Value Date/Time   CHOL 104 04/26/2014 0850   TRIG 50.0 04/26/2014 0850   HDL 37.90 (L) 04/26/2014 0850   CHOLHDL 3 04/26/2014 0850   VLDL 10.0 04/26/2014 0850   LDLCALC 56 04/26/2014 0850    Home Medications   No outpatient medications have been marked as taking for the 06/18/22 encounter (Office Visit) with Sharlene Dory, PA-C.     Review of Systems      All other systems reviewed and are otherwise  negative except as noted above.  Physical Exam    VS:  BP 128/70   Pulse 64   Ht 6' 2.5" (1.892 m)   Wt 171 lb 12.8 oz (77.9 kg)   SpO2 99%   BMI 21.76 kg/m  , BMI Body mass index is 21.76 kg/m.  Wt Readings from Last 3 Encounters:  06/18/22 171 lb 12.8 oz (77.9 kg)  11/23/21 170 lb 3.2 oz (77.2 kg)  06/08/21 172 lb (78 kg)     GEN: Well nourished, well developed, in no acute distress. HEENT: normal. Neck: Supple, no JVD, carotid bruits, or masses. Cardiac: RRR, no murmurs, rubs, or gallops. No clubbing, cyanosis, edema.  Radials/PT 2+ and equal bilaterally.  Respiratory:  Respirations regular and unlabored, clear to auscultation bilaterally. GI: Soft, nontender, nondistended. MS: No deformity or atrophy. Skin: Warm and dry, no rash. Neuro:  Strength and sensation are intact. Psych: Normal affect.  Assessment & Plan    CAD -never needed to take nitro -Anginal equivalent was center of his back pain, he has not had this since his stent -Would recommend continuing aspirin 81 mg daily, Crestor 5 mg daily -He is continuing a plant-based diet  PVCs -when he exercises he would get them -PVCs seems to be much better controlled -Continue current medication regimen  Hyperlipidemia -Continue Crestor -No recent lipid panel/LFTs -We will scan in lab work from PCP -LDL 56  Aortic atherosclerosis -Continue Crestor  Disposition: Follow up 6 months with Lance Muss, MD or APP.  Signed, Sharlene Dory, PA-C 06/18/2022, 2:22 PM [facility]

## 2022-06-22 NOTE — Addendum Note (Signed)
Addended by: Anselm Lis A on: 06/22/2022 01:02 PM   Modules accepted: Orders

## 2022-09-05 LAB — LAB REPORT - SCANNED
A1c: 6
EGFR: 88
PSA, Total: 1.94

## 2022-11-11 LAB — LAB REPORT - SCANNED: EGFR: 89

## 2022-11-19 LAB — LAB REPORT - SCANNED: EGFR: 89

## 2022-12-20 ENCOUNTER — Ambulatory Visit: Payer: Medicare PPO | Admitting: Interventional Cardiology

## 2023-01-21 ENCOUNTER — Ambulatory Visit (HOSPITAL_BASED_OUTPATIENT_CLINIC_OR_DEPARTMENT_OTHER): Payer: Medicare PPO | Admitting: Cardiovascular Disease

## 2023-01-21 ENCOUNTER — Encounter (HOSPITAL_BASED_OUTPATIENT_CLINIC_OR_DEPARTMENT_OTHER): Payer: Self-pay | Admitting: Cardiovascular Disease

## 2023-01-21 VITALS — BP 124/72 | HR 70 | Ht 74.0 in | Wt 173.8 lb

## 2023-01-21 DIAGNOSIS — I714 Abdominal aortic aneurysm, without rupture, unspecified: Secondary | ICD-10-CM

## 2023-01-21 DIAGNOSIS — I10 Essential (primary) hypertension: Secondary | ICD-10-CM

## 2023-01-21 DIAGNOSIS — I493 Ventricular premature depolarization: Secondary | ICD-10-CM

## 2023-01-21 DIAGNOSIS — I2510 Atherosclerotic heart disease of native coronary artery without angina pectoris: Secondary | ICD-10-CM

## 2023-01-21 DIAGNOSIS — R7303 Prediabetes: Secondary | ICD-10-CM

## 2023-01-21 DIAGNOSIS — E782 Mixed hyperlipidemia: Secondary | ICD-10-CM

## 2023-01-21 HISTORY — DX: Prediabetes: R73.03

## 2023-01-21 NOTE — Patient Instructions (Signed)
 Medication Instructions:  Your physician recommends that you continue on your current medications as directed. Please refer to the Current Medication list given to you today.  *If you need a refill on your cardiac medications before your next appointment, please call your pharmacy*   Lab Work: Lipoprotein A - Today   If you have labs (blood work) drawn today and your tests are completely normal, you will receive your results only by: MyChart Message (if you have MyChart) OR A paper copy in the mail If you have any lab test that is abnormal or we need to change your treatment, we will call you to review the results.   Testing/Procedures: Your physician has referred you to see Dr. Vinie Maxcy in the lipid clinic   Your physician has requested that you have an abdominal aorta duplex. During this test, an ultrasound is used to evaluate the aorta. Allow 30 minutes for this exam. Do not eat after midnight the day before and avoid carbonated beverages.  Please note: We ask at that you not bring children with you during ultrasound (echo/ vascular) testing. Due to room size and safety concerns, children are not allowed in the ultrasound rooms during exams. Our front office staff cannot provide observation of children in our lobby area while testing is being conducted. An adult accompanying a patient to their appointment will only be allowed in the ultrasound room at the discretion of the ultrasound technician under special circumstances. We apologize for any inconvenience.    Follow-Up: At [facility], you and your health needs are our priority.  As part of our continuing mission to provide you with exceptional heart care, we have created designated Provider Care Teams.  These Care Teams include your primary Cardiologist (physician) and Advanced Practice Providers (APPs -  Physician Assistants and Nurse Practitioners) who all work together to provide you with the care you need, when you  need it.  We recommend signing up for the patient portal called MyChart.  Sign up information is provided on this After Visit Summary.  MyChart is used to connect with patients for Virtual Visits (Telemedicine).  Patients are able to view lab/test results, encounter notes, upcoming appointments, etc.  Non-urgent messages can be sent to your provider as well.   To learn more about what you can do with MyChart, go to [email or web address].    Your next appointment:   12 month(s)  Provider:   Annabella Scarce, MD    Other Instructions

## 2023-01-21 NOTE — Progress Notes (Signed)
 Cardiology Office Note:  .   Date:  01/21/2023  ID:  Billy Adams, DOB Oct 26, 1945, MRN [id number] PCP: Windy Coy, MD  [facility] Providers Cardiologist:  Candyce Reek, MD    History of Present Illness: .   Billy Adams is a 78 y.o. male with history of CAD status post LAD PCI, aortic atherosclerosis, pre-diabetes, small abdominal aortic aneurysm, and PVCs here for follow-up.  He was previously a patient of Dr. Reek.  He has a longstanding history of palpitations especially in stressful situations.  He wore Holter monitor 07/2012 that revealed frequent PVCs (7%).  He continued to have palpitations after removing caffeine from his diet.  At the time of his PCI his only symptom was discomfort in his back.  He had chest pain and an abnormal stress test in 2014 that led to a coronary CTA.  This revealed a calcium  score of 1659 which was 95th percentile.  He also had significant proximal LAD disease.  He went for cardiac catheterization and had 2 drug-eluting stents placed.  His PVCs decreased after PCI.  He had a nuclear stress test 05/2021 that revealed LVEF 62% and no ischemia.  When he saw Dr. Reek 11/2021 he discussed his concerns about long-term side effect of statins.  He was following a whole food plant-based diet.  He saw Orren Fabry, [facility] 05/2012 and noted occasional shortness of breath but no chest pain.  Billy Adams presents with concerns of lightheadedness and vertigo. The vertigo is described as severe initially, but has since lessened and is now primarily experienced when lying down and turning the head to the left.  He also reports occasional lightheadedness upon standing after sitting for a prolonged period, lasting for a few seconds.  He has a history of cervical stenosis due to an old neck injury, which he suspects may be contributing to his symptoms. He also expresses concern about his cardiac health as a potential cause.  Billy Adams has been managing  the vertigo with meclizine as advised by his primary care physician.  He also reports cold sensation in the lower legs and feet, more pronounced on the left side. He recalls a previous scan indicating some narrowing of the femoral artery in the left leg. He also notes a lack of temperature sensation in the lower legs, which he attributes to lumbar stenosis.  Billy Adams has been following a mostly plant-based diet and has been managing his cholesterol with pravastatin. However, he reports experiencing leg cramps, particularly in the back of the thigh, which he attributes to the medication. Due to this he reduced the dose of pravastatin previously recommended by his PCP. He also reports some brain fog and difficulty with word retrieval, which he believes has worsened since switching from rosuvastatin  to pravastatin.  Billy Adams has been adhering to a low-sugar diet in recent days, after noticing a rush or lightheadedness when consuming jam and other sugary foods. He expresses concern about his glucose levels, which have been borderline for the past twenty years.  He also notes a long-standing issue with low HDL cholesterol levels and high LDL small particle numbers, which he is keen to address.  He reports engaging in regular physical activity, such as gardening, but acknowledges the need for more consistent aerobic exercise. He expresses some concern about the potential cardiovascular strain of exercising in extreme temperatures.     ROS:  As per HPI  Studies Reviewed: SABRA   EKG Interpretation Date/Time:  Friday January 21 2023 09:11:26  EST Ventricular Rate:  70 PR Interval:  152 QRS Duration:  98 QT Interval:  380 QTC Calculation: 410 R Axis:   78  Text Interpretation: Normal sinus rhythm Normal ECG When compared with ECG of 01-Feb-2013 04:47, No significant change was found Confirmed by Raford Riggs ([id number]) on 01/21/2023 9:21:00 AM   Lexiscan  Myoview  05/2021:   Exercise stress test:   Clinically negative, electrically negative for ischemia.   Myoview  scan shows normal perfusion   The study is normal. The study is low risk.   LVEF calcuated at 62% with normal wall motion   Low risk study  Ambulatory monitor 05/2021: Normal sinus rhythm with episodes of SVT. Single, brief episode of nonsustained VT. No atrial fibrillation.   Risk Assessment/Calculations:             Physical Exam:   VS:  BP 124/72 (BP Location: Left Arm, Patient Position: Sitting)   Pulse 70   Ht 6' 2 (1.88 m)   Wt 173 lb 12.8 oz (78.8 kg)   SpO2 99%   BMI 22.31 kg/m  , BMI Body mass index is 22.31 kg/m. GENERAL:  Well appearing HEENT: Pupils equal round and reactive, fundi not visualized, oral mucosa unremarkable NECK:  No jugular venous distention, waveform within normal limits, carotid upstroke brisk and symmetric, no bruits, no thyromegaly LUNGS:  Clear to auscultation bilaterally HEART:  RRR.  PMI not displaced or sustained,S1 and S2 within normal limits, no S3, no S4, no clicks, no rubs, no murmurs ABD:  Flat, positive bowel sounds normal in frequency in pitch, no bruits, no rebound, no guarding, no midline pulsatile mass, no hepatomegaly, no splenomegaly EXT:  2 plus pulses throughout, no edema, no cyanosis no clubbing SKIN:  No rashes no nodules NEURO:  Cranial nerves II through XII grossly intact, motor grossly intact throughout PSYCH:  Cognitively intact, oriented to person place and time   ASSESSMENT AND PLAN: .    # CAD s/p LAD PCI:  # Hyperlipidemia History of DES to LAD in 2014.  Currently on Pravastatin with reported leg cramps. LDL small particle number elevated. HDL consistently low.  -Check LP(a) today. -Consult with lipid specialist (Dr. Mona) for further management. - Continue pravastatin for now.  If numbers are not improved, would consider switching back to rosuvastatin . -Continue plan-based diet and increase exercise to at least 150 minutes weekly  # Peripheral  Neuropathy Loss of temperature sensation in lower legs, likely due to lumbar stenosis.  Peripheral pulses are excellent. -No specific plan discussed.  # Abdominal Aortic Aneurysm Last ultrasound in 2018. -Repeat abdominal aortic ultrasound.  # Prediabetes A1c increased from 5.5 to 6.0. -Primary care physician to check A1c with upcoming labs.   # PVCs:  Currently asymptomatic and none seen on EKG today.    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 No recent sinus congestion or drainage. -Patient declines referral to vestibular physical therapy.  General Health Maintenance -Increase exercise to 3 days per week. -Follow-up in 1 year or sooner if needed.     Time spent: 45 minutes-Greater than 50% of this time was spent in reviewing patient's chart, records brought to this appointment, counseling, explanation of diagnosis, planning of further management, and coordination of care.   Signed, Riggs Raford, MD

## 2023-01-22 LAB — LIPOPROTEIN A (LPA): Lipoprotein (a): 13.4 nmol/L (ref <75.0)

## 2023-01-24 LAB — LAB REPORT - SCANNED
EGFR: 90
PSA, Total: 0.8

## 2023-02-04 ENCOUNTER — Ambulatory Visit (HOSPITAL_COMMUNITY)
Admission: RE | Admit: 2023-02-04 | Discharge: 2023-02-04 | Disposition: A | Discharge status: Home / Self Care | Payer: Medicare PPO | Source: Ambulatory Visit | Attending: Cardiovascular Disease | Admitting: Cardiovascular Disease

## 2023-02-04 DIAGNOSIS — I714 Abdominal aortic aneurysm, without rupture, unspecified: Secondary | ICD-10-CM | POA: Diagnosis present

## 2023-02-07 ENCOUNTER — Encounter (HOSPITAL_BASED_OUTPATIENT_CLINIC_OR_DEPARTMENT_OTHER): Payer: Self-pay | Admitting: Cardiovascular Disease

## 2023-02-09 ENCOUNTER — Encounter (HOSPITAL_BASED_OUTPATIENT_CLINIC_OR_DEPARTMENT_OTHER): Payer: Self-pay | Admitting: Cardiovascular Disease

## 2023-03-18 ENCOUNTER — Ambulatory Visit: Payer: Medicare PPO | Attending: Internal Medicine | Admitting: Internal Medicine

## 2023-03-18 ENCOUNTER — Encounter: Payer: Self-pay | Admitting: Internal Medicine

## 2023-03-18 ENCOUNTER — Other Ambulatory Visit (HOSPITAL_COMMUNITY): Payer: Self-pay

## 2023-03-18 ENCOUNTER — Telehealth: Payer: Self-pay | Admitting: Pharmacy Technician

## 2023-03-18 VITALS — BP 120/56 | HR 59 | Ht 74.0 in | Wt 171.0 lb

## 2023-03-18 DIAGNOSIS — I2510 Atherosclerotic heart disease of native coronary artery without angina pectoris: Secondary | ICD-10-CM

## 2023-03-18 DIAGNOSIS — R7303 Prediabetes: Secondary | ICD-10-CM | POA: Diagnosis not present

## 2023-03-18 DIAGNOSIS — E785 Hyperlipidemia, unspecified: Secondary | ICD-10-CM | POA: Diagnosis not present

## 2023-03-18 MED ORDER — REPATHA SURECLICK 140 MG/ML ~~LOC~~ SOAJ: 140.0000 mg | SUBCUTANEOUS | 11 refills | Status: DC

## 2023-03-18 NOTE — Telephone Encounter (Signed)
 Pharmacy Patient Advocate Encounter  Received notification from [facility] that Prior Authorization for Repatha has been APPROVED from 01/19/23 to 01/17/24. Ran test claim, Copay is $40.00- one month. This test claim was processed through [facility]- copay amounts may vary at other pharmacies due to pharmacy/plan contracts, or as the patient moves through the different stages of their insurance plan.   PA #/Case ID/Reference #: [id number]

## 2023-03-18 NOTE — Telephone Encounter (Signed)
 error

## 2023-03-18 NOTE — Telephone Encounter (Signed)
 Rx(s) sent to pharmacy electronically.  Left message for patient that med is approved, Rx sent to [facility], and [employer] approved (pharmacy updated on this by [employer])

## 2023-03-18 NOTE — Progress Notes (Addendum)
 LIPID CLINIC CONSULT NOTE  Chief Complaint:  Manage dyslipidemia  Primary Care Physician: Mosetta Putt, MD  Primary Cardiologist:  Chilton Si, MD  HPI:  Billy Adams is a 78 y.o. male who is being seen today for the evaluation of dyslipidemia at the request of Chilton Si, MD. This is a pleasant 78 year old male [facility] referred for evaluation management of dyslipidemia.  He was referred by Dr. Duke Salvia regarding questions about dyslipidemia.  There is a history of coronary artery disease in the past with a stent in 2014.  He has had longstanding issues with high particle numbers despite pretty well-controlled cholesterol.  His last lipid profile showed total cholesterol 116, triglycerides 67, LDL 63 and HDL 39 with a small LDL particle number of 257 and LDL-P of 1346.  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  He had been on statins in the past namely higher dose rosuvastatin but had side effects with it.  He was then switched to pravastatin but also had some side effects with that.  He was advised to go to rosuvastatin 5 mg daily last year and ezetimibe was added.  When the ezetimibe was added he reported having some more issues with muscle cramps and then he stopped the medication.  His recent labs in January unfortunately look a little bit worse with an increase in LDL particle number to 1906 and an LDL of 88.  His goal LDL should be less than 70 ideally with a particle number less than 1000.  He seems to be tolerating rosuvastatin 5 mg daily.  PMHx:  Past Medical History:  Diagnosis Date   AAA (abdominal aortic aneurysm) (HCC)    pt denies   Allergy    seasonal   Arthralgia of foot    bilateral   BPH (benign prostatic hypertrophy) 01/18/1997   CAD (coronary artery disease), residual post PCI with 40% LM, 30% LCX 12/25/2012   Cataract    surgery   Coronary artery disease    DES TO LAD X2   Degenerative disk disease    of cervical spine w/occasional radicular symptoms since 1985    Detached retina 01/18/2001   Detached retina    When right cataract was repaired, so Dr. Luciana Axe has recommened delaying the left side repair longer so there will be less chance of detachedment of the retina again.   GERD (gastroesophageal reflux disease)    occasionally-relief with tums or zantac   Hypercholesterolemia    Hyperlipidemia 01/18/1993   Hypertension    Impaired glucose tolerance 01/18/2009   Lactose intolerance    Left cataract    Osteoarthritis of both feet    Palpitations    Pt has noticed an increase of palps lately, especially after exercise   Prediabetes 01/21/2023   Prostate nodule 01/19/2007   Seeing Dr. Laverle Patter. Had another biopsy last year wich was benign. PSAs have been stable.   PVC's (premature ventricular contractions)    Holter monitor 07/27/12 showed frequent PVCs, representing about 7% of his beats. Has improved since we reduced his caffeine.   Reflux esophagitis 01/18/2002   Improved over last year   S/P coronary artery stent placement -Prox LAD, DES 01/31/13  02/01/2013   Sleeping difficulties 01/19/2000   2-3 nights a wk. Takes Xanax & Melatonin PRN.   Varicose vein     Past Surgical History:  Procedure Laterality Date   CARDIAC CATHETERIZATION  01/31/2013   DR [facility]   CATARACT EXTRACTION W/ INTRAOCULAR LENS IMPLANT Right 2001   CORONARY ANGIOPLASTY  01/31/2013  DES TO LAD X2  DR [facility]   LEFT HEART CATHETERIZATION WITH CORONARY ANGIOGRAM Bilateral 01/31/2013   Procedure: LEFT HEART CATHETERIZATION WITH CORONARY ANGIOGRAM;  Surgeon: Laurey Morale, MD;  Location: [facility] CATH LAB;  Service: Cardiovascular;  Laterality: Bilateral;   LESION REMOVAL  11/25/2011   Procedure: MINOR EXICISION OF LESION;  Surgeon: Wyn Forster., MD;  Location: [facility];  Service: Orthopedics;  Laterality: Right;  REMOVE THORN RIGHT INDEX FINGER   PERCUTANEOUS CORONARY STENT INTERVENTION (PCI-S) Left 01/31/2013   Procedure: PERCUTANEOUS CORONARY STENT  INTERVENTION (PCI-S);  Surgeon: Laurey Morale, MD;  Location: [facility] CATH LAB;  Service: Cardiovascular;  Laterality: Left;  des x2 prox lad, ffr   RETINAL DETACHMENT SURGERY Right 2003   With Sling Procedure   TONSILLECTOMY  1950    FAMHx:  Family History  Problem Relation Age of Onset   Coronary artery disease Father    Heart attack Father    Heart disease Father        rheumatic heart disease   CVA Mother    Atrial fibrillation Mother 1   Bipolar disorder Sister    Seasonal affective disorder Sister    Anxiety disorder Sister    Colon cancer Neg Hx     SOCHx:   reports that he has never smoked. He has never used smokeless tobacco. He reports current alcohol use. He reports that he does not use drugs.  ALLERGIES:  Allergies  Allergen Reactions   Other     RYE> diarrhea, cats, tobacco smoke, alternaria   Pravastatin Other (See Comments)    Brain fog, myalgias     ROS: Pertinent items noted in HPI and remainder of comprehensive ROS otherwise negative.  HOME MEDS: Current Outpatient Medications on File Prior to Visit  Medication Sig Dispense Refill   aspirin EC 81 MG tablet Take 1 tablet (81 mg total) by mouth daily. 90 tablet 3   cholecalciferol (VITAMIN D) 1000 units tablet Take 1,000 Units by mouth daily.     Coenzyme Q10 (CO Q 10 PO) Take 200 mg by mouth daily.     COVID-19 mRNA vaccine 2023-2024 (COMIRNATY) SUSP injection Inject into the muscle.     GRAPE SEED CR PO Take 1 capsule by mouth daily. [place] grape seed (resveratol)     MELATONIN PO Take 3 mg by mouth as needed (for sleep).     metFORMIN (GLUCOPHAGE-XR) 500 MG 24 hr tablet TAKE ONE TABLET BY MOUTH TWICE DAILY AFTER MEALS     Multiple Vitamins-Minerals (MULTIVITAMIN WITH MINERALS) tablet Take 1 tablet by mouth daily.     nitroGLYCERIN (NITROSTAT) 0.4 MG SL tablet Place 1 tablet (0.4 mg total) under the tongue every 5 (five) minutes as needed for chest pain. 100 tablet 3   Omega-3 Fatty Acids (FISH OIL  PO) Take 1 capsule by mouth daily.     rosuvastatin (CRESTOR) 5 MG tablet Take by mouth.     rosuvastatin (CRESTOR) 5 MG tablet Take 5 mg by mouth daily.     vitamin B-12 (CYANOCOBALAMIN) 1000 MCG tablet Take 1,000 mcg by mouth daily.     No current facility-administered medications on file prior to visit.    LABS/IMAGING: No results found for this or any previous visit (from the past 48 hours). No results found.  LIPID PANEL:    Component Value Date/Time   CHOL 104 04/26/2014 0850   TRIG 50.0 04/26/2014 0850   HDL 37.90 (L) 04/26/2014 0850   CHOLHDL  3 04/26/2014 0850   VLDL 10.0 04/26/2014 0850   LDLCALC 56 04/26/2014 0850    WEIGHTS: Wt Readings from Last 3 Encounters:  03/18/23 171 lb (77.6 kg)  01/21/23 173 lb 12.8 oz (78.8 kg)  06/18/22 171 lb 12.8 oz (77.9 kg)    VITALS: BP (!) 120/56   Pulse (!) 59   Ht 6\' 2"  (1.88 m)   Wt 171 lb (77.6 kg)   SpO2 98%   BMI 21.96 kg/m   EXAM: Deferred  EKG: Deferred  ASSESSMENT: Dyslipidemia, goal LDL less than 70 History of coronary artery disease status post PCI to the LAD in 2014 Prediabetes  PLAN: 1.   Mr. Coller has a persistent dyslipidemia with a high particle number.  Unfortunately could not tolerate ezetimibe or pravastatin.  He is able to take low-dose rosuvastatin.  I would recommend continuing that but he will need additional therapy.  I advise starting PCSK9 inhibitor.  This should help his particle number specifically.  Will plan Repatha every 2 weeks.  Plan repeat lipid NMR in about 3 to 4 months and I will contact him with those results.  Thanks as always for the kind referral.  Chrystie Nose, MD, Milagros Loll  [facility]  Medical Director of the Advanced Lipid Disorders &  Cardiovascular Risk Reduction Clinic Diplomate of the American Board of Clinical Lipidology Attending Cardiologist  Direct Dial: [phone]  Fax: [phone]  Website:  [email or web address]  Lisette Abu  Khiley Lieser 03/18/2023, 1:21 PM

## 2023-03-18 NOTE — Patient Instructions (Signed)
 Medication Instructions:  Dr. Rennis Golden recommends Repatha 140mg /mL (PCSK9). This is an injectable cholesterol medication self-administered once every 14 days. This medication will likely need prior approval with your insurance company, which we will work on. If the medication is not approved initially, we may need to do an appeal with your insurance.   Administer medication in area of fatty tissue such as abdomen, outer thigh, back of upper arm - and rotate site with each injection Store medication in refrigerator until ready to administer - allow to sit at room temp for 30 mins - 1 hour prior to injection Dispose of medication in a SHARPS container - your pharmacy should be able to direct you on this and proper disposal   Patient Assistance:    These foundations have funds at various times.   The [employer] Foundation: https://www.panfoundation.org/disease-funds/hypercholesterolemia/ -- can sign up for wait list  [facility] offers assistance to help pay for medication copays.  They will cover copays for all cholesterol lowering meds, including statins, fibrates, omega-3 fish oils like Vascepa, ezetimibe, Repatha, Praluent, Nexletol, Nexlizet.  The cards are usually good for $2,500 or 12 months, whichever comes first. Our fax # is [phone] (you will need this to apply) Go to healthwellfoundation.org Click on "Apply Now" Answer questions as to whom is applying (patient or representative) Your disease fund will be "hypercholesterolemia - Medicare access" They will ask questions about finances and which medications you are taking for cholesterol When you submit, the approval is usually within minutes.  You will need to print the card information from the site You will need to show this information to your pharmacy, they will bill your Medicare Part D plan first -then bill Health Well --for the copay.   You can also call them at [phone], although the hold times can be quite long.      *If you need a refill on your cardiac medications before your next appointment, please call your pharmacy*   Lab Work: FASTING lab work in 3-4 months  If you have labs (blood work) drawn today and your tests are completely normal, you will receive your results only by: MyChart Message (if you have MyChart) OR A paper copy in the mail If you have any lab test that is abnormal or we need to change your treatment, we will call you to review the results.  Follow-Up: At [facility], you and your health needs are our priority.  As part of our continuing mission to provide you with exceptional heart care, we have created designated Provider Care Teams.  These Care Teams include your primary Cardiologist (physician) and Advanced Practice Providers (APPs -  Physician Assistants and Nurse Practitioners) who all work together to provide you with the care you need, when you need it.  We recommend signing up for the patient portal called "MyChart".  Sign up information is provided on this After Visit Summary.  MyChart is used to connect with patients for Virtual Visits (Telemedicine).  Patients are able to view lab/test results, encounter notes, upcoming appointments, etc.  Non-urgent messages can be sent to your provider as well.   To learn more about what you can do with MyChart, go to [email or web address].    Your next appointment:   12 months with Dr. Rennis Golden or sooner as needed, per lab work   Routine appointments with Dr. Duke Salvia as planned

## 2023-03-18 NOTE — Telephone Encounter (Signed)
 Pharmacy Patient Advocate Encounter   Received notification from Pt Calls Messages that prior authorization for Repatha is required/requested.   Insurance verification completed.   The patient is insured through [employer] .   Per test claim: PA required; PA submitted to above mentioned insurance via CoverMyMeds Key/confirmation #/EOC [id number] Status is pending

## 2023-03-18 NOTE — Telephone Encounter (Signed)
 Patient Advocate Encounter   The patient was approved for a [employer] grant that will help cover the cost of repatha Total amount awarded, 2500.  Effective: 02/16/23 - 02/15/24   [id number] [id number] [id number]   Pharmacy provided with approval and processing information but they do not have the a prescription yet-I sent a message to the staff.

## 2023-03-21 ENCOUNTER — Telehealth: Payer: Self-pay | Admitting: Internal Medicine

## 2023-03-21 MED ORDER — ROSUVASTATIN CALCIUM 5 MG PO TABS: 5.0000 mg | ORAL_TABLET | Freq: Every day | ORAL | 3 refills | Status: DC

## 2023-03-21 NOTE — Telephone Encounter (Signed)
*  STAT* If patient is at the pharmacy, call can be transferred to refill team.   1. Which medications need to be refilled? (please list name of each medication and dose if known)   rosuvastatin (CRESTOR) 5 MG tablet     2. Would you like to learn more about the convenience, safety, & potential cost savings by using the [facility]? No   3. Are you open to using the [facility] (Type [facility]. ) No   4. Which pharmacy/location (including street and city if local pharmacy) is medication to be sent to? [facility] [place], [place] - [address]    5. Do they need a 30 day or 90 day supply? 90 day

## 2023-05-27 ENCOUNTER — Telehealth (HOSPITAL_BASED_OUTPATIENT_CLINIC_OR_DEPARTMENT_OTHER): Payer: Self-pay | Admitting: Cardiovascular Disease

## 2023-05-27 DIAGNOSIS — Z5181 Encounter for therapeutic drug level monitoring: Secondary | ICD-10-CM

## 2023-05-27 NOTE — Telephone Encounter (Signed)
 Pt c/o medication issue:  1. Name of Medication:   rosuvastatin  (CRESTOR ) 5 MG tablet  Evolocumab  (REPATHA  SURECLICK) 140 MG/ML SOAJ   2. How are you currently taking this medication (dosage and times per day)?   Patient stated he has discontinued these medication last night  3. Are you having a reaction (difficulty breathing--STAT)?   Back pain, urinating more frequently, light colored stool  4. What is your medication issue?   Patient stated he is having some side-effects from one or both of these medications.  Patient wants advice on next steps.  Patient also wants to get lab test results.

## 2023-05-27 NOTE — Telephone Encounter (Signed)
 Spoke with patient regarding his Crestor  and Repatha   Has been taking the Crestor  for some time, Repatha  started couple months ago Has noticed increased urinary output and stools getting lighter  Yesterday stool was "dramatically" lighter than normal  Patient reviewed side effects of these medications and is concerned coming from them  Is very concerned about his liver  Feels he is having memory fog   Advised patient to continue to hold Repatha  and Crestor , will forward to Dr Maximo Spar for review

## 2023-05-30 ENCOUNTER — Encounter (HOSPITAL_BASED_OUTPATIENT_CLINIC_OR_DEPARTMENT_OTHER): Payer: Self-pay | Admitting: Cardiovascular Disease

## 2023-05-30 NOTE — Telephone Encounter (Signed)
 Hazle Lites, MD to Me    05/30/23 12:06 PM  Ok .Billy Adams Please order a CK and CMET - keep off medicines for now.  Dr. Marvina Slough  Advised patient, verbalized understanding  He will likely go tomorrow for labs  Patient is very concerned that his cholesterol too low and this is causing him issues   Sent a mychart message below   I have been increasingly experiencing lack of energy and endurance, memory problems, muscle  weakness, decreased appetite, and light colored stools (most recent). I talked with Nihira Puello last Friday and she suggested discontinuing both drugs until I hear from you or Dr. Maximo Spar. I noticed that My Lipoprotein a was 13.4  in January and should be under 75.   Should I really be on Repatha  to lower it further?                  Please advise, ASAP      Will forward to Dr Maximo Spar so he will be aware of this when he reviews his labs

## 2023-05-30 NOTE — Telephone Encounter (Signed)
 Please review and advise.

## 2023-05-30 NOTE — Telephone Encounter (Signed)
 Mychart message sent to patient.

## 2023-05-30 NOTE — Telephone Encounter (Signed)
See phone note  5/9 

## 2023-06-01 ENCOUNTER — Ambulatory Visit: Payer: Self-pay | Admitting: Internal Medicine

## 2023-06-01 LAB — COMPREHENSIVE METABOLIC PANEL WITH GFR
ALT: 17 IU/L (ref 0–44)
AST: 25 IU/L (ref 0–40)
Albumin: 4.5 g/dL (ref 3.8–4.8)
Alkaline Phosphatase: 54 IU/L (ref 44–121)
BUN/Creatinine Ratio: 19 (ref 10–24)
BUN: 16 mg/dL (ref 8–27)
Bilirubin Total: 0.5 mg/dL (ref 0.0–1.2)
CO2: 24 mmol/L (ref 20–29)
Calcium: 9.4 mg/dL (ref 8.6–10.2)
Chloride: 98 mmol/L (ref 96–106)
Creatinine, Ser: 0.84 mg/dL (ref 0.76–1.27)
Globulin, Total: 2.5 g/dL (ref 1.5–4.5)
Glucose: 93 mg/dL (ref 70–99)
Potassium: 4.8 mmol/L (ref 3.5–5.2)
Sodium: 136 mmol/L (ref 134–144)
Total Protein: 7 g/dL (ref 6.0–8.5)
eGFR: 88 mL/min/{1.73_m2} (ref >59)

## 2023-06-01 LAB — CK: Total CK: 254 U/L (ref 41–331)

## 2023-07-06 ENCOUNTER — Encounter (HOSPITAL_BASED_OUTPATIENT_CLINIC_OR_DEPARTMENT_OTHER): Payer: Self-pay | Admitting: Cardiovascular Disease

## 2023-07-07 NOTE — Telephone Encounter (Signed)
 Are you okay with adding this on to his upcoming blood work?

## 2023-07-10 ENCOUNTER — Ambulatory Visit (HOSPITAL_BASED_OUTPATIENT_CLINIC_OR_DEPARTMENT_OTHER): Payer: Self-pay | Admitting: Internal Medicine

## 2023-07-10 LAB — NMR, LIPOPROFILE
Cholesterol, Total: 80 mg/dL — ABNORMAL LOW (ref 100–199)
HDL Particle Number: 32.3 umol/L (ref >=30.5)
HDL-C: 47 mg/dL (ref >39)
LDL Particle Number: <300 nmol/L (ref <1000)
LDL-C (NIH Calc): 20 mg/dL (ref 0–99)
LP-IR Score: 30 (ref <=45)
Small LDL Particle Number: 127 nmol/L (ref <=527)
Triglycerides: 47 mg/dL (ref 0–149)

## 2023-07-16 NOTE — Progress Notes (Signed)
 Cardiology Office Note   Date:  07/18/2023  ID:  Glynn Yepes, DOB [birth year], MRN [id number] PCP: Windy Coy, MD  [facility] Providers Cardiologist:  Annabella Scarce, MD     PMH Dyslipidemia Coronary artery disease S/p DES x 2 to LAD Mild residual disease in LM, LCx AAA PVCs  Referred to advanced lipid disorder clinic and seen by Dr. Mona 03/18/2023.  History of CAD with stent in 2014.  He has had longstanding issues with high particle numbers despite pretty well-controlled cholesterol.  His last lipid profile showed total cholesterol 116, triglycerides 67, LDL 63 and HDL 39 with a small LDL particle number of 257 and LDL-P of 1346.  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  He had side effects on higher dose rosuvastatin .  He was then switched to pravastatin but also had some side effects with that.  He was advised to take rosuvastatin  5 mg daily last year and ezetimibe was added.  When the ezetimibe was added he reported having issues with muscle cramps and then stopped the medication.  Labs in January 2024 looked a bit worse with increase in LDL particle number 03/08/2004 and LDL of 88.  His goal LDL should be less than 70 ideally with a particle number less than 1000.  He was tolerating rosuvastatin  5 mg daily.  He contacted our office 05/27/23 with concerns that he was urinating more frequently and stool was light in color.  He was advised to hold rosuvastatin  and Repatha .  CK was normal and metabolic profile revealed normal liver enzymes.  He was advised to resume the medications.  NMR 07/08/2023 revealed LDL particle number < 300, LDL-C 20, HDL-C 47, total cholesterol 80, small LDL-P 127.   History of Present Illness Discussed the use of AI scribe software for clinical note transcription with the patient, who gave verbal consent to proceed.  History of Present Illness Amro Winebarger is a very pleasant 78 year old male who is here today for follow-up of  coronary artery disease and  hyperlipidemia. He is accompanied by his wife, Vina.  He reports concerns about cholesterol being too low, particularly with balance issues, muscle weakness, and cognitive delay. He is currently on rosuvastatin  5 mg daily and Repatha  140 mg every 14 days. His LDL particle number is now below 300, a significant improvement from previous levels above 1000. His HDL has improved to 47. Has been following a mostly vegan diet for > 10 years, since coronary stents were placed. He also notes a decrease in stamina, feeling out of breath after walking on level ground for 10-15 minutes. His wife notes that when they are walking together, she is not even out of breath and he is having difficulty getting a good breath. He is able to garden in hot weather though without concerning cardiac symptoms and is able to do the elliptical or stationary bike at [employer] without shortness of breath or chest pain.  He denies orthopnea, PND, edema, presyncope, syncope.  He monitors his blood pressure at home, which was recently recorded as low at 95/54. He has been off metformin for about a week.    ROS: See HPI  Studies Reviewed       Lipoprotein (a)  Date/Time Value Ref Range Status  01/21/2023 10:26 AM 13.4 <75.0 nmol/L Final    Comment:    Note:  Values greater than or equal to 75.0 nmol/L may        indicate an independent risk factor for CHD,  but must be evaluated with caution when applied        to non-Caucasian populations due to the        influence of genetic factors on Lp(a) across        ethnicities.     Risk Assessment/Calculations           Physical Exam VS:  BP 134/68 (BP Location: Left Arm, Patient Position: Sitting, Cuff Size: Normal)   Pulse (!) 57   Ht 6' 2 (1.88 m)   Wt 170 lb (77.1 kg)   SpO2 97%   BMI 21.83 kg/m    Wt Readings from Last 3 Encounters:  07/18/23 170 lb (77.1 kg)  03/18/23 171 lb (77.6 kg)  01/21/23 173 lb 12.8 oz (78.8 kg)    GEN: Well nourished, well  developed in no acute distress NECK: No JVD; No carotid bruits CARDIAC: RRR, no murmurs, rubs, gallops RESPIRATORY:  Clear to auscultation without rales, wheezing or rhonchi  ABDOMEN: Soft, non-tender, non-distended EXTREMITIES:  No edema; No deformity    Assessment & Plan Shortness of breath   He is experiencing new onset, progressively worsening shortness of breath, particularly with walking and decrease in stamina. No orthopnea, PND, edema, or weight gain. Prior stenting of LAD as noted below. We will get cardiac PET CT to assess for worsening ischemia.   Coronary artery disease with stents   History of chest pain abnormal stress test in 2015 that led to coronary CTA with significantly elevated calcium  score, significant proximal LAD disease s/p DES x 2.  Stress test 05/2021 no ischemia. As noted above, has noted increased SOB with walking. He is also concerned about long-term effects of lipid lowering agents. We will get cardiac PET CT for evaluation of ischemia.  Continue heart healthy diet and regular exercise. BP is well controlled. ER precautions advised.  Continue aspirin , Repatha , rosuvastatin .   Hyperlipidemia LDL goal < 70 NMR lipoprofile with LDL particle number < 300, LDL-C 20, HDL-C 47, triglycerides 47, total cholesterol 80 and LDL-P 127. He is concerned about neurological health with low cholesterol levels. He is reading the book Grain Brain and feels he is at higher risk for Parkinson's and other neurological issues due to low cholesterol. LDL particle numbers are now significantly reduced. After lengthy discussion about evidence that low cholesterol is not harmful, he would like to decrease rosuvastatin  to 5 mg three days a week, and extend Repatha  injections to every 21 days. We will recheck NMR lipid panel in three months to reassess cholesterol levels.  Balance issues   He is concerned about long-term effects of blood cholesterol on neurological status.  He reports increased  balance issues.  He is also having vision problems, cervical spine stenosis, and vertigo. Is planning to have lab work with PCP in the next few days.  We are reducing intensity lipid therapy as noted above. Additional testing/management per PCP.       Informed Consent   Shared Decision Making/Informed Consent The risks [chest pain, shortness of breath, cardiac arrhythmias, dizziness, blood pressure fluctuations, myocardial infarction, stroke/transient ischemic attack, nausea, vomiting, allergic reaction, radiation exposure, metallic taste sensation and life-threatening complications (estimated to be 1 in 10,000)], benefits (risk stratification, diagnosing coronary artery disease, treatment guidance) and alternatives of a cardiac PET stress test were discussed in detail with Mr. Ripp and he agrees to proceed.     Dispo: 3 months with me with NMR prior  Signed, Rosaline Bane, NP-C

## 2023-07-18 ENCOUNTER — Ambulatory Visit (HOSPITAL_BASED_OUTPATIENT_CLINIC_OR_DEPARTMENT_OTHER): Admitting: Nurse Practitioner

## 2023-07-18 ENCOUNTER — Encounter (HOSPITAL_BASED_OUTPATIENT_CLINIC_OR_DEPARTMENT_OTHER): Payer: Self-pay | Admitting: Nurse Practitioner

## 2023-07-18 VITALS — BP 134/68 | HR 57 | Ht 74.0 in | Wt 170.0 lb

## 2023-07-18 DIAGNOSIS — E785 Hyperlipidemia, unspecified: Secondary | ICD-10-CM

## 2023-07-18 DIAGNOSIS — R0609 Other forms of dyspnea: Secondary | ICD-10-CM

## 2023-07-18 DIAGNOSIS — I25118 Atherosclerotic heart disease of native coronary artery with other forms of angina pectoris: Secondary | ICD-10-CM | POA: Diagnosis not present

## 2023-07-18 DIAGNOSIS — R2689 Other abnormalities of gait and mobility: Secondary | ICD-10-CM

## 2023-07-18 MED ORDER — REPATHA SURECLICK 140 MG/ML ~~LOC~~ SOAJ: 140.0000 mg | SUBCUTANEOUS | Status: AC

## 2023-07-18 NOTE — Patient Instructions (Signed)
 Medication Instructions:   CHANGE Rosuvastatin  one (1) tablet by mouth ( 5 mg) three times weekly.  CHANGE Repatha  one injection (140 mg) subcutaneous every 21 days.  *If you need a refill on your cardiac medications before your next appointment, please call your pharmacy*  Lab Work:  Your physician recommends that you return for a FASTING NMR, fasting after midnight in 3 months one week prior to appointment in September with Billy. Patient given paperwork today.    If you have labs (blood work) drawn today and your tests are completely normal, you will receive your results only by: MyChart Message (if you have MyChart) OR A paper copy in the mail If you have any lab test that is abnormal or we need to change your treatment, we will call you to review the results.  Testing/Procedures:     Please report to Radiology at the [facility] Main Entrance 30 minutes early for your test.  [address] [place], [place] [address]    How to Prepare for Your Cardiac PET/CT Stress Test:  Nothing to eat or drink, except water, 3 hours prior to arrival time.  NO caffeine/decaffeinated products, or chocolate 12 hours prior to arrival. (Please note decaffeinated beverages (teas/coffees) still contain caffeine).  If you have caffeine within 12 hours prior, the test will need to be rescheduled.  Medication instructions: Do not take erectile dysfunction medications for 72 hours prior to test (sildenafil, tadalafil)   You may take your remaining medications with water.  NO cologne or lotion on chest or abdomen area.  Total time is 1 to 2 hours; you may want to bring reading material for the waiting time.  In preparation for your appointment, medication and supplies will be purchased.  Appointment availability is limited, so if you need to cancel or reschedule, please call the Radiology Department Scheduler at [phone] 24 hours in advance to avoid a cancellation fee of  $100.00  What to Expect When you Arrive:  Once you arrive and check in for your appointment, you will be taken to a preparation room within the Radiology Department.  A technologist or Nurse will obtain your medical history, verify that you are correctly prepped for the exam, and explain the procedure.  Afterwards, an IV will be started in your arm and electrodes will be placed on your skin for EKG monitoring during the stress portion of the exam. Then you will be escorted to the PET/CT scanner.  There, staff will get you positioned on the scanner and obtain a blood pressure and EKG.  During the exam, you will continue to be connected to the EKG and blood pressure machines.  A small, safe amount of a radioactive tracer will be injected in your IV to obtain a series of pictures of your heart along with an injection of a stress agent.    After your Exam:  It is recommended that you eat a meal and drink a caffeinated beverage to counter act any effects of the stress agent.  Drink plenty of fluids for the remainder of the day and urinate frequently for the first couple of hours after the exam.  Your doctor will inform you of your test results within 7-10 business days.  For more information and frequently asked questions, please visit our website: [email or web address]  For questions about your test or how to prepare for your test, please call: Cardiac Imaging Nurse Navigators Office: [phone]   Follow-Up: At [facility], you and your health  needs are our priority.  As part of our continuing mission to provide you with exceptional heart care, our providers are all part of one team.  This team includes your primary Cardiologist (physician) and Advanced Practice Providers or APPs (Physician Assistants and Nurse Practitioners) who all work together to provide you with the care you need, when you need it.  Your next appointment:   3 month(s)  Provider:   Rosaline Bane,  NP    We recommend signing up for the patient portal called MyChart.  Sign up information is provided on this After Visit Summary.  MyChart is used to connect with patients for Virtual Visits (Telemedicine).  Patients are able to view lab/test results, encounter notes, upcoming appointments, etc.  Non-urgent messages can be sent to your provider as well.   To learn more about what you can do with MyChart, go to [email or web address].   Other Instructions  [employer] PAL APP

## 2023-07-25 ENCOUNTER — Encounter (HOSPITAL_BASED_OUTPATIENT_CLINIC_OR_DEPARTMENT_OTHER): Payer: Self-pay

## 2023-07-29 IMAGING — CR DG CHEST 2V
2 series · 2 of 2 positions shown · non-contrast
Comparison: 06/06/2013

CLINICAL DATA: Shortness of breath

EXAM:
CHEST - 2 VIEW

[w chest pa]
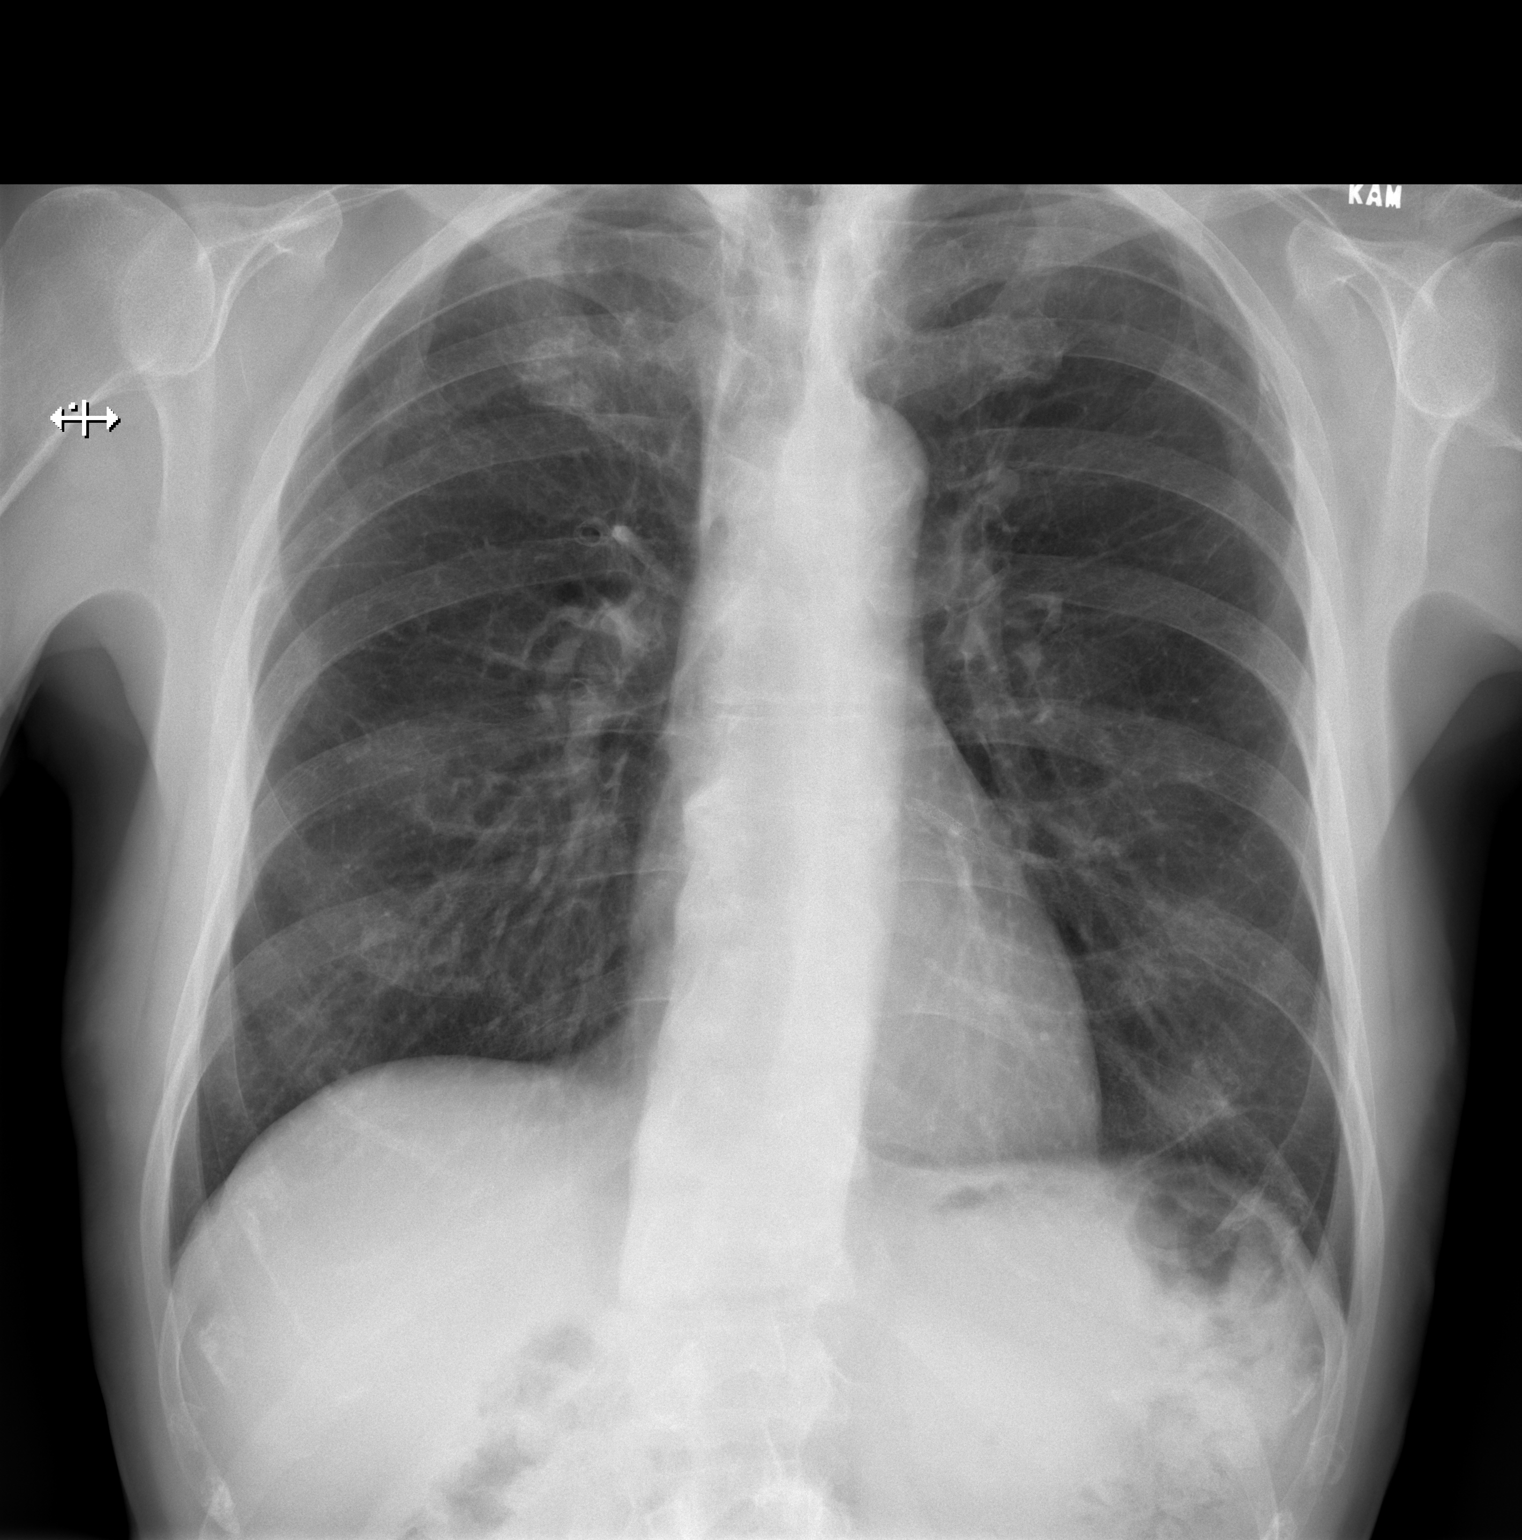

[w chest lat]
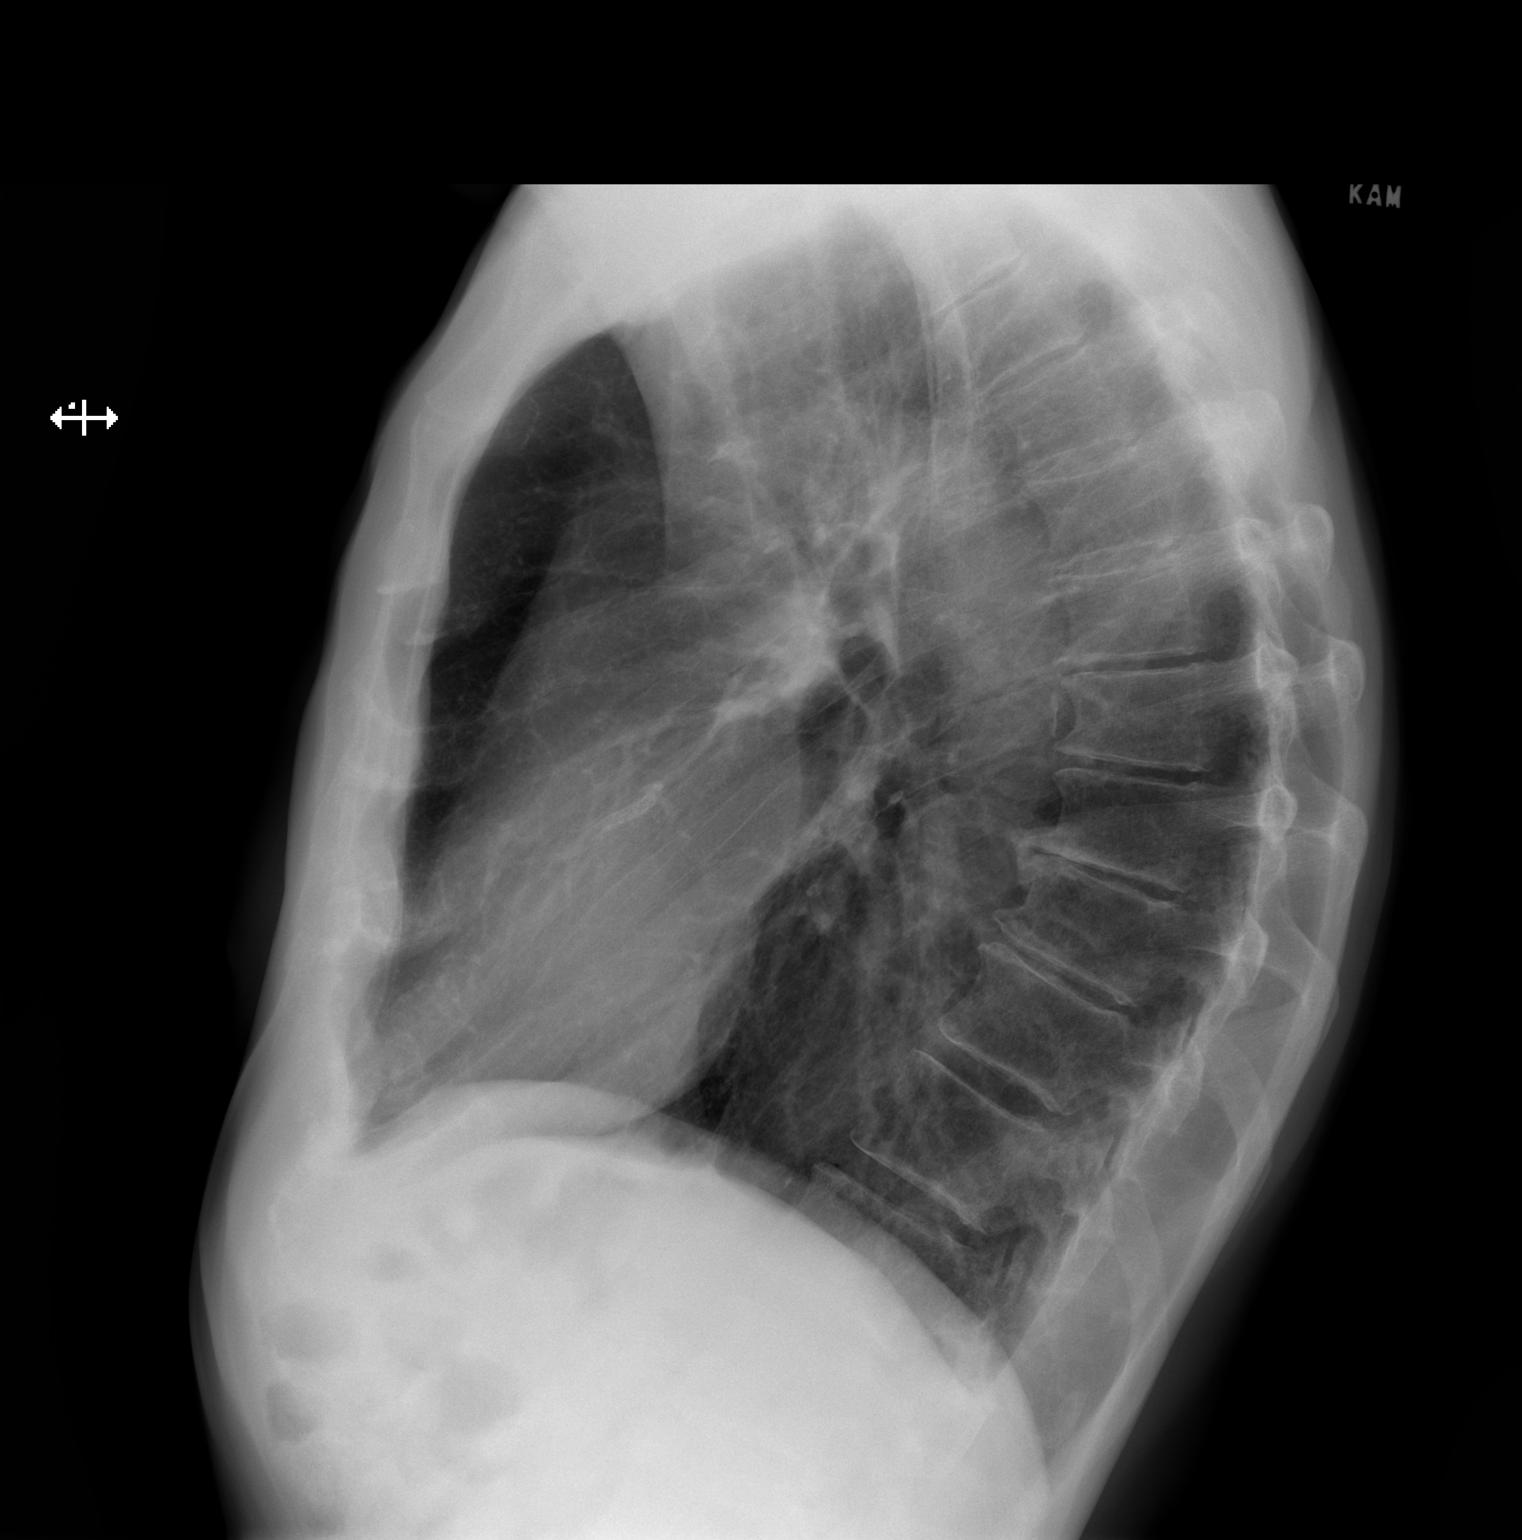

[2 of 2 positions shown; findings below may reference images not displayed]

FINDINGS: Cardiac size is within normal limits. Increase in AP diameter of
chest suggests COPD. There is no focal pulmonary consolidation.
There are no signs of alveolar pulmonary edema. There is no pleural
effusion or pneumothorax.
IMPRESSION: COPD. There are no signs of pulmonary edema or focal pulmonary
consolidation.

## 2023-08-09 ENCOUNTER — Other Ambulatory Visit (HOSPITAL_BASED_OUTPATIENT_CLINIC_OR_DEPARTMENT_OTHER): Payer: Self-pay | Admitting: *Deleted

## 2023-08-09 DIAGNOSIS — I25118 Atherosclerotic heart disease of native coronary artery with other forms of angina pectoris: Secondary | ICD-10-CM

## 2023-08-09 DIAGNOSIS — E785 Hyperlipidemia, unspecified: Secondary | ICD-10-CM

## 2023-08-09 DIAGNOSIS — Z5181 Encounter for therapeutic drug level monitoring: Secondary | ICD-10-CM

## 2023-08-25 ENCOUNTER — Encounter (HOSPITAL_BASED_OUTPATIENT_CLINIC_OR_DEPARTMENT_OTHER): Payer: Self-pay

## 2023-09-07 ENCOUNTER — Inpatient Hospital Stay (HOSPITAL_COMMUNITY): Admission: RE | Admit: 2023-09-07 | Source: Ambulatory Visit

## 2023-09-26 ENCOUNTER — Encounter (HOSPITAL_COMMUNITY): Payer: Self-pay

## 2023-09-27 ENCOUNTER — Telehealth (HOSPITAL_COMMUNITY): Payer: Self-pay | Admitting: *Deleted

## 2023-09-27 NOTE — Telephone Encounter (Signed)
 Reaching out to patient to offer assistance regarding upcoming cardiac imaging study; pt verbalizes understanding of appt date/time, parking situation and where to check in, pre-test NPO status, and verified current allergies; name and call back number provided for further questions should they arise  Larey Brick RN Navigator Cardiac Imaging [facility] [phone] office [phone] cell  Patient aware to avoid caffeine 12 hours prior to his cardiac PET scan.

## 2023-09-28 ENCOUNTER — Ambulatory Visit (HOSPITAL_COMMUNITY)
Admission: RE | Admit: 2023-09-28 | Discharge: 2023-09-28 | Disposition: A | Discharge status: Home / Self Care | Source: Ambulatory Visit | Attending: Nurse Practitioner | Admitting: Nurse Practitioner

## 2023-09-28 DIAGNOSIS — I25118 Atherosclerotic heart disease of native coronary artery with other forms of angina pectoris: Secondary | ICD-10-CM | POA: Insufficient documentation

## 2023-09-28 DIAGNOSIS — E785 Hyperlipidemia, unspecified: Secondary | ICD-10-CM | POA: Diagnosis present

## 2023-09-28 DIAGNOSIS — R0609 Other forms of dyspnea: Secondary | ICD-10-CM | POA: Diagnosis present

## 2023-09-28 LAB — NM PET CT CARDIAC PERFUSION MULTI W/ABSOLUTE BLOODFLOW
LV dias vol: 93 mL (ref 62–150)
LV sys vol: 62 mL (ref 4.2–5.8)
MBFR: 2.21
Nuc Rest EF: 33 %
Nuc Stress EF: 57 %
Rest MBF: 0.68 ml/g/min
Rest Nuclear Isotope Dose: 20.2 mCi
ST Depression (mm): 0 mm
Stress MBF: 1.5 ml/g/min
Stress Nuclear Isotope Dose: 20 mCi

## 2023-09-28 MED ORDER — RUBIDIUM RB82 GENERATOR (RUBYFILL)
20.0000 | PACK | Freq: Once | INTRAVENOUS | Status: AC
  Administered 2023-09-28: 20 via INTRAVENOUS

## 2023-09-28 MED ORDER — RUBIDIUM RB82 GENERATOR (RUBYFILL)
20.1700 | PACK | Freq: Once | INTRAVENOUS | Status: AC
  Administered 2023-09-28: 20.17 via INTRAVENOUS

## 2023-09-28 MED ORDER — REGADENOSON 0.4 MG/5ML IV SOLN
0.4000 mg | Freq: Once | INTRAVENOUS | Status: AC
Start: 2023-09-28 — End: 2023-09-28
  Administered 2023-09-28: 0.4 mg via INTRAVENOUS

## 2023-09-28 MED ORDER — REGADENOSON 0.4 MG/5ML IV SOLN
INTRAVENOUS | Status: AC
  Filled 2023-09-28: qty 5

## 2023-09-29 ENCOUNTER — Ambulatory Visit: Payer: Self-pay | Admitting: Nurse Practitioner

## 2023-09-29 DIAGNOSIS — I2510 Atherosclerotic heart disease of native coronary artery without angina pectoris: Secondary | ICD-10-CM

## 2023-11-01 ENCOUNTER — Ambulatory Visit (HOSPITAL_COMMUNITY)
Admission: RE | Admit: 2023-11-01 | Discharge: 2023-11-01 | Disposition: A | Discharge status: Home / Self Care | Source: Ambulatory Visit | Attending: Internal Medicine | Admitting: Internal Medicine

## 2023-11-01 DIAGNOSIS — I2510 Atherosclerotic heart disease of native coronary artery without angina pectoris: Secondary | ICD-10-CM | POA: Diagnosis present

## 2023-11-02 ENCOUNTER — Ambulatory Visit: Payer: Self-pay | Admitting: Nurse Practitioner

## 2023-11-02 ENCOUNTER — Encounter (HOSPITAL_BASED_OUTPATIENT_CLINIC_OR_DEPARTMENT_OTHER): Admitting: Nurse Practitioner

## 2023-11-02 LAB — ECHOCARDIOGRAM COMPLETE
Area-P 1/2: 2.26 cm2
S' Lateral: 3.3 cm

## 2023-11-15 ENCOUNTER — Encounter (HOSPITAL_BASED_OUTPATIENT_CLINIC_OR_DEPARTMENT_OTHER): Payer: Self-pay

## 2023-11-17 LAB — NMR, LIPOPROFILE
Cholesterol, Total: 92 mg/dL — ABNORMAL LOW (ref 100–199)
HDL Particle Number: 34 umol/L (ref >=30.5)
HDL-C: 47 mg/dL (ref >39)
LDL Particle Number: 367 nmol/L (ref <1000)
LDL Size: 20 nm — ABNORMAL LOW (ref >20.5)
LDL-C (NIH Calc): 33 mg/dL (ref 0–99)
LP-IR Score: 37 (ref <=45)
Small LDL Particle Number: 192 nmol/L (ref <=527)
Triglycerides: 48 mg/dL (ref 0–149)

## 2023-11-18 ENCOUNTER — Ambulatory Visit: Payer: Self-pay | Admitting: Nurse Practitioner

## 2023-11-18 NOTE — Progress Notes (Unsigned)
 Cardiology Office Note   Date:  11/23/2023  ID:  Kedron Uno, DOB Feb 20, 1945, MRN [id number] PCP: Windy Coy, MD  [facility] Providers Cardiologist:  Annabella Scarce, MD     PMH Dyslipidemia Coronary artery disease S/p DES x 2 to LAD Mild residual disease in LM, LCx AAA PVCs  Referred to advanced lipid disorder clinic and seen by Dr. Mona 03/18/2023.  History of CAD with stent in 2014.  He has had longstanding issues with high particle numbers despite pretty well-controlled cholesterol.  His last lipid profile showed total cholesterol 116, triglycerides 67, LDL 63 and HDL 39 with a small LDL particle number of 257 and LDL-P of 1346.  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  He had side effects on higher dose rosuvastatin .  He was then switched to pravastatin but also had some side effects with that.  He was advised to take rosuvastatin  5 mg daily last year and ezetimibe was added.  When the ezetimibe was added he reported having issues with muscle cramps and then stopped the medication.  Labs in January 2024 looked a bit worse with increase in LDL particle number 03/08/2004 and LDL of 88.  His goal LDL should be less than 70 ideally with a particle number less than 1000.  He was tolerating rosuvastatin  5 mg daily.  He contacted our office 05/27/23 with concerns that he was urinating more frequently and stool was light in color. Was advised to hold rosuvastatin  and Repatha .  CK was normal and metabolic profile revealed normal liver enzymes.  He was advised to resume the medications.  NMR 07/08/2023 revealed LDL particle number < 300, LDL-C 20, HDL-C 47, total cholesterol 80, small LDL-P 127.   Seen by me on 07/18/23  for follow-up of  coronary artery disease and hyperlipidemia and accompanied by his wife, Vina.  He reports concerns about cholesterol being too low, particularly with balance issues, muscle weakness, and cognitive delay. Currently on rosuvastatin  5 mg daily and Repatha  140 mg every 14  days. LDL particle number is now below 300, a significant improvement from previous levels above 1000, HDL has improved to 47. Has been following a mostly vegan diet for > 10 years, since coronary stents were placed. He also notes a decrease in stamina, feeling out of breath after walking on level ground for 10-15 minutes. His wife notes that when they are walking together, she is not even out of breath and he is having difficulty getting a good breath. He is able to garden in hot weather though without concerning cardiac symptoms and is able to do the elliptical or stationary bike at [employer] without shortness of breath or chest pain.  No orthopnea, PND, edema, presyncope, syncope. Home BP recently recorded as low at 95/54. He has been off metformin for about a week.  He underwent cardiac PET/CT for evaluation of ischemia which revealed normal LV perfusion, no ischemia or infarction.  EF noted to be suspiciously low, so echo completed 11/01/23 with normal LVEF 55-60%, G1DD, normal RV. After lengthy discussion about evidence that low cholesterol is not harmful, he would like to decrease rosuvastatin  to 5 mg three days a week, and extend Repatha  injections to every 21 days. We will recheck NMR lipid panel in three months to reassess cholesterol levels.  NMR 11/16/23 revealed LDL particle number 367, LDL-C 33, HDL-C 47, triglycerides 48, total cholesterol 92, small LDL-P 192.   History of Present Illness Discussed the use of AI scribe software for clinical note transcription with  the patient, who gave verbal consent to proceed.  History of Present Illness Couper Juncaj is a very pleasant 78 year old male who is here today   ROS: See HPI  Studies Reviewed       Lipoprotein (a)  Date/Time Value Ref Range Status  01/21/2023 10:26 AM 13.4 <75.0 nmol/L Final    Comment:    Note:  Values greater than or equal to 75.0 nmol/L may        indicate an independent risk factor for CHD,        but must  be evaluated with caution when applied        to non-Caucasian populations due to the        influence of genetic factors on Lp(a) across        ethnicities.     Risk Assessment/Calculations           Physical Exam VS:  BP 122/70   Pulse (!) 57   Ht 6' 2 (1.88 m)   Wt 171 lb 9.6 oz (77.8 kg)   SpO2 97%   BMI 22.03 kg/m    Wt Readings from Last 3 Encounters:  11/23/23 171 lb 9.6 oz (77.8 kg)  07/18/23 170 lb (77.1 kg)  03/18/23 171 lb (77.6 kg)    GEN: Well nourished, well developed in no acute distress NECK: No JVD; No carotid bruits CARDIAC: RRR, no murmurs, rubs, gallops RESPIRATORY:  Clear to auscultation without rales, wheezing or rhonchi  ABDOMEN: Soft, non-tender, non-distended EXTREMITIES:  No edema; No deformity    Assessment & Plan Shortness of breath   PET CT 09/28/23 revealed no evidence of ischemia or infarction but LVEF was reduced.  He is experiencing new onset, progressively worsening shortness of breath, particularly with walking and decrease in stamina. No orthopnea, PND, edema, or weight gain. Prior stenting of LAD as noted below. We will get cardiac PET CT to assess for worsening ischemia.   Coronary artery disease with stents   History of chest pain abnormal stress test in 2015 that led to coronary CTA with significantly elevated calcium  score, significant proximal LAD disease s/p DES x 2.  Stress test 05/2021 no ischemia. As noted above, has noted increased SOB with walking. He is also concerned about long-term effects of lipid lowering agents. We will get cardiac PET CT for evaluation of ischemia.  Continue heart healthy diet and regular exercise. BP is well controlled. ER precautions advised.  Continue aspirin , Repatha , rosuvastatin .   Hyperlipidemia LDL goal < 70 NMR lipoprofile with LDL particle number < 300, LDL-C 20, HDL-C 47, triglycerides 47, total cholesterol 80 and LDL-P 127. He is concerned about neurological health with low cholesterol levels.  He is reading the book Grain [employer] and feels he is at higher risk for Parkinson's and other neurological issues due to low cholesterol. LDL particle numbers are now significantly reduced. After lengthy discussion about evidence that low cholesterol is not harmful, he would like to decrease rosuvastatin  to 5 mg three days a week, and extend Repatha  injections to every 21 days. We will recheck NMR lipid panel in three months to reassess cholesterol levels.  Balance issues   He is concerned about long-term effects of blood cholesterol on neurological status.  He reports increased balance issues.  He is also having vision problems, cervical spine stenosis, and vertigo. Is planning to have lab work with PCP in the next few days.  We are reducing intensity lipid therapy as noted above. Additional testing/management  per PCP.       Disposition: ***  Signed, Rosaline Bane, NP-C

## 2023-11-23 ENCOUNTER — Encounter (HOSPITAL_BASED_OUTPATIENT_CLINIC_OR_DEPARTMENT_OTHER): Payer: Self-pay | Admitting: Nurse Practitioner

## 2023-11-23 ENCOUNTER — Ambulatory Visit (HOSPITAL_BASED_OUTPATIENT_CLINIC_OR_DEPARTMENT_OTHER): Admitting: Nurse Practitioner

## 2023-11-23 VITALS — BP 122/70 | HR 57 | Ht 74.0 in | Wt 171.6 lb

## 2023-11-23 DIAGNOSIS — E785 Hyperlipidemia, unspecified: Secondary | ICD-10-CM | POA: Diagnosis not present

## 2023-11-23 DIAGNOSIS — Z5181 Encounter for therapeutic drug level monitoring: Secondary | ICD-10-CM

## 2023-11-23 DIAGNOSIS — I700 Atherosclerosis of aorta: Secondary | ICD-10-CM | POA: Diagnosis not present

## 2023-11-23 DIAGNOSIS — R0609 Other forms of dyspnea: Secondary | ICD-10-CM

## 2023-11-23 DIAGNOSIS — I2510 Atherosclerotic heart disease of native coronary artery without angina pectoris: Secondary | ICD-10-CM | POA: Diagnosis not present

## 2023-11-23 DIAGNOSIS — R7303 Prediabetes: Secondary | ICD-10-CM

## 2023-11-23 NOTE — Patient Instructions (Signed)
 Medication Instructions:   Your physician recommends that you continue on your current medications as directed. Please refer to the Current Medication list given to you today.   *If you need a refill on your cardiac medications before your next appointment, please call your pharmacy*  Lab Work: Your physician recommends that you return for lab work in: 6 months   NMR Lipoprofile and A1C  If you have labs (blood work) drawn today and your tests are completely normal, you will receive your results only by: MyChart Message (if you have MyChart) OR A paper copy in the mail If you have any lab test that is abnormal or we need to change your treatment, we will call you to review the results.  Testing/Procedures: None ordered.  Follow-Up: At [facility], you and your health needs are our priority.  As part of our continuing mission to provide you with exceptional heart care, our providers are all part of one team.  This team includes your primary Cardiologist (physician) and Advanced Practice Providers or APPs (Physician Assistants and Nurse Practitioners) who all work together to provide you with the care you need, when you need it.  Your next appointment:   6 month(s)  Provider:   Rosaline Percy PIETY  We recommend signing up for the patient portal called MyChart.  Sign up information is provided on this After Visit Summary.  MyChart is used to connect with patients for Virtual Visits (Telemedicine).  Patients are able to view lab/test results, encounter notes, upcoming appointments, etc.  Non-urgent messages can be sent to your provider as well.   To learn more about what you can do with MyChart, go to [email or web address].   Other Instructions  Your physician wants you to follow-up in: 6 months.  You will receive a reminder letter in the mail two months in advance. If you don't receive a letter, please call our office to schedule the follow-up appointment.
